# Patient Record
Sex: Male | Born: 1940
Health system: Southern US, Community
[De-identification: ages and names within clinical notes are randomized; demographics above are authoritative.]

## PROBLEM LIST (undated history)

## (undated) DIAGNOSIS — I10 Essential (primary) hypertension: Secondary | ICD-10-CM

## (undated) DIAGNOSIS — E785 Hyperlipidemia, unspecified: Secondary | ICD-10-CM

## (undated) DIAGNOSIS — I639 Cerebral infarction, unspecified: Secondary | ICD-10-CM

## (undated) HISTORY — PX: RUPTURED GLOBE EXPLORATION AND REPAIR: SHX2366

## (undated) HISTORY — PX: ENUCLEATION: SHX628

---

## 2004-04-03 ENCOUNTER — Other Ambulatory Visit: Payer: Self-pay

## 2004-10-14 ENCOUNTER — Inpatient Hospital Stay: Payer: Self-pay

## 2005-01-20 ENCOUNTER — Other Ambulatory Visit: Payer: Self-pay

## 2005-01-20 ENCOUNTER — Inpatient Hospital Stay: Payer: Self-pay | Admitting: Anesthesiology

## 2005-11-13 ENCOUNTER — Other Ambulatory Visit: Payer: Self-pay

## 2005-11-13 ENCOUNTER — Inpatient Hospital Stay: Payer: Self-pay | Admitting: Internal Medicine

## 2005-11-16 ENCOUNTER — Ambulatory Visit: Payer: Self-pay | Admitting: Psychiatry

## 2006-01-11 ENCOUNTER — Other Ambulatory Visit: Payer: Self-pay

## 2006-01-11 ENCOUNTER — Emergency Department: Payer: Self-pay | Admitting: Unknown Physician Specialty

## 2006-02-23 ENCOUNTER — Ambulatory Visit: Payer: Self-pay | Admitting: Pain Medicine

## 2006-03-03 ENCOUNTER — Ambulatory Visit: Payer: Self-pay | Admitting: Pain Medicine

## 2006-03-08 ENCOUNTER — Ambulatory Visit: Payer: Self-pay | Admitting: Pain Medicine

## 2006-04-10 ENCOUNTER — Inpatient Hospital Stay: Payer: Self-pay | Admitting: Internal Medicine

## 2006-04-10 ENCOUNTER — Other Ambulatory Visit: Payer: Self-pay

## 2006-04-13 ENCOUNTER — Ambulatory Visit: Payer: Self-pay | Admitting: Pain Medicine

## 2006-04-19 ENCOUNTER — Ambulatory Visit: Payer: Self-pay | Admitting: Pain Medicine

## 2006-06-03 ENCOUNTER — Ambulatory Visit: Payer: Self-pay | Admitting: Pain Medicine

## 2006-06-21 ENCOUNTER — Ambulatory Visit: Payer: Self-pay | Admitting: Pain Medicine

## 2006-07-20 ENCOUNTER — Ambulatory Visit: Payer: Self-pay | Admitting: Pain Medicine

## 2006-07-26 ENCOUNTER — Ambulatory Visit: Payer: Self-pay | Admitting: Pain Medicine

## 2006-10-12 ENCOUNTER — Ambulatory Visit: Payer: Self-pay | Admitting: Pain Medicine

## 2006-10-25 ENCOUNTER — Ambulatory Visit: Payer: Self-pay | Admitting: Pain Medicine

## 2006-12-02 ENCOUNTER — Ambulatory Visit: Payer: Self-pay | Admitting: Pain Medicine

## 2006-12-13 ENCOUNTER — Ambulatory Visit: Payer: Self-pay | Admitting: Pain Medicine

## 2006-12-27 ENCOUNTER — Other Ambulatory Visit: Payer: Self-pay

## 2006-12-27 ENCOUNTER — Inpatient Hospital Stay: Payer: Self-pay | Admitting: *Deleted

## 2007-01-13 ENCOUNTER — Ambulatory Visit: Payer: Self-pay | Admitting: Pain Medicine

## 2007-01-24 ENCOUNTER — Ambulatory Visit: Payer: Self-pay | Admitting: Pain Medicine

## 2007-03-03 ENCOUNTER — Ambulatory Visit: Payer: Self-pay | Admitting: Pain Medicine

## 2007-03-07 ENCOUNTER — Ambulatory Visit: Payer: Self-pay | Admitting: Pain Medicine

## 2007-04-21 ENCOUNTER — Ambulatory Visit: Payer: Self-pay | Admitting: Pain Medicine

## 2007-04-27 ENCOUNTER — Ambulatory Visit: Payer: Self-pay | Admitting: Pain Medicine

## 2007-06-06 ENCOUNTER — Emergency Department: Payer: Self-pay | Admitting: Emergency Medicine

## 2007-06-06 ENCOUNTER — Other Ambulatory Visit: Payer: Self-pay

## 2007-07-01 ENCOUNTER — Inpatient Hospital Stay: Payer: Self-pay | Admitting: Internal Medicine

## 2007-07-01 ENCOUNTER — Other Ambulatory Visit: Payer: Self-pay

## 2007-11-23 ENCOUNTER — Other Ambulatory Visit: Payer: Self-pay

## 2007-11-24 ENCOUNTER — Other Ambulatory Visit: Payer: Self-pay

## 2007-11-24 ENCOUNTER — Inpatient Hospital Stay: Payer: Self-pay | Admitting: Internal Medicine

## 2008-03-17 ENCOUNTER — Inpatient Hospital Stay: Payer: Self-pay | Admitting: Internal Medicine

## 2009-03-05 ENCOUNTER — Inpatient Hospital Stay: Payer: Self-pay | Admitting: Internal Medicine

## 2010-02-21 ENCOUNTER — Encounter: Admission: RE | Admit: 2010-02-21 | Discharge: 2010-02-21 | Payer: Self-pay | Admitting: Cardiovascular Disease

## 2010-04-07 ENCOUNTER — Encounter: Admission: RE | Admit: 2010-04-07 | Discharge: 2010-04-07 | Payer: Self-pay | Admitting: Orthopedic Surgery

## 2010-04-28 ENCOUNTER — Ambulatory Visit (HOSPITAL_COMMUNITY): Admission: RE | Admit: 2010-04-28 | Discharge: 2010-04-28 | Payer: Self-pay | Admitting: Orthopedic Surgery

## 2010-11-28 LAB — BASIC METABOLIC PANEL
BUN: 10 mg/dL (ref 6–23)
Calcium: 9.6 mg/dL (ref 8.4–10.5)
Potassium: 4.1 mEq/L (ref 3.5–5.1)
Sodium: 138 mEq/L (ref 135–145)

## 2010-11-28 LAB — CBC
Hemoglobin: 11.9 g/dL — ABNORMAL LOW (ref 13.0–17.0)
MCH: 31.3 pg (ref 26.0–34.0)
MCV: 93.2 fL (ref 78.0–100.0)
RBC: 3.8 MIL/uL — ABNORMAL LOW (ref 4.22–5.81)
WBC: 4.5 10*3/uL (ref 4.0–10.5)

## 2010-11-28 LAB — SURGICAL PCR SCREEN
MRSA, PCR: NEGATIVE
Staphylococcus aureus: NEGATIVE

## 2012-10-24 ENCOUNTER — Other Ambulatory Visit: Payer: Self-pay | Admitting: Cardiovascular Disease

## 2012-10-24 DIAGNOSIS — G459 Transient cerebral ischemic attack, unspecified: Secondary | ICD-10-CM

## 2012-10-28 ENCOUNTER — Other Ambulatory Visit: Payer: Self-pay

## 2012-11-07 ENCOUNTER — Ambulatory Visit
Admission: RE | Admit: 2012-11-07 | Discharge: 2012-11-07 | Disposition: A | Discharge status: Home / Self Care | Payer: Medicare Other | Source: Ambulatory Visit | Attending: Cardiovascular Disease | Admitting: Cardiovascular Disease

## 2012-11-07 DIAGNOSIS — G459 Transient cerebral ischemic attack, unspecified: Secondary | ICD-10-CM

## 2015-03-15 ENCOUNTER — Observation Stay
Admission: EM | Admit: 2015-03-15 | Discharge: 2015-03-16 | Disposition: A | Discharge status: Home / Self Care | Payer: Medicare Other | Attending: Internal Medicine | Admitting: Internal Medicine

## 2015-03-15 ENCOUNTER — Emergency Department: Payer: Medicare Other

## 2015-03-15 DIAGNOSIS — Z9001 Acquired absence of eye: Secondary | ICD-10-CM | POA: Insufficient documentation

## 2015-03-15 DIAGNOSIS — R42 Dizziness and giddiness: Secondary | ICD-10-CM | POA: Diagnosis not present

## 2015-03-15 DIAGNOSIS — Z7902 Long term (current) use of antithrombotics/antiplatelets: Secondary | ICD-10-CM | POA: Insufficient documentation

## 2015-03-15 DIAGNOSIS — Z8673 Personal history of transient ischemic attack (TIA), and cerebral infarction without residual deficits: Secondary | ICD-10-CM | POA: Insufficient documentation

## 2015-03-15 DIAGNOSIS — Z79899 Other long term (current) drug therapy: Secondary | ICD-10-CM | POA: Diagnosis not present

## 2015-03-15 DIAGNOSIS — R531 Weakness: Secondary | ICD-10-CM | POA: Insufficient documentation

## 2015-03-15 DIAGNOSIS — F17210 Nicotine dependence, cigarettes, uncomplicated: Secondary | ICD-10-CM | POA: Insufficient documentation

## 2015-03-15 DIAGNOSIS — R062 Wheezing: Secondary | ICD-10-CM

## 2015-03-15 DIAGNOSIS — G459 Transient cerebral ischemic attack, unspecified: Secondary | ICD-10-CM | POA: Diagnosis present

## 2015-03-15 DIAGNOSIS — E785 Hyperlipidemia, unspecified: Secondary | ICD-10-CM | POA: Insufficient documentation

## 2015-03-15 DIAGNOSIS — Z8249 Family history of ischemic heart disease and other diseases of the circulatory system: Secondary | ICD-10-CM | POA: Insufficient documentation

## 2015-03-15 DIAGNOSIS — I10 Essential (primary) hypertension: Secondary | ICD-10-CM | POA: Diagnosis not present

## 2015-03-15 HISTORY — DX: Essential (primary) hypertension: I10

## 2015-03-15 HISTORY — DX: Hyperlipidemia, unspecified: E78.5

## 2015-03-15 HISTORY — DX: Cerebral infarction, unspecified: I63.9

## 2015-03-15 NOTE — ED Provider Notes (Signed)
[facility] Emergency Department Provider Note  ____________________________________________  Time seen: Approximately 11:15 PM  I have reviewed the triage vital signs and the nursing notes.   HISTORY  Chief Complaint Numbness and Near Syncope    HPI Terry Knox is a 74 y.o. male who presents to the ED via EMS for lightheadedness, dizziness and left arm weakness. Symptoms started approximately 9 PM while patient was getting up to wash the dishes. He felt a combination of the room spinning as well as lightheaded and felt like he was going to pass out. He sat down quickly so that he would not fall. He reports numbness in the left upper extremity. He was nauseated and reports some sweating, but no vomiting. Patient denies headache, chest pain, shortness of breath, diarrhea, weakness. Patient has a history of TIAs and is on Plavix daily. Reports improving symptoms.States the paramedics told him his speech was slurred upon their arrival.   Past Medical History  Diagnosis Date  . Hypertension   . Stroke     There are no active problems to display for this patient.  Past surgical history Right eye prosthesis   No current outpatient prescriptions on file.  Allergies Review of patient's allergies indicates no known allergies.  No family history on file.  Social History History  Substance Use Topics  . Smoking status: Not on file  . Smokeless tobacco: Not on file  . Alcohol Use: Not on file  No recent alcohol use  Review of Systems Constitutional: No fever/chills Eyes: No visual changes. ENT: No sore throat. Cardiovascular: Denies chest pain. Respiratory: Denies shortness of breath. Gastrointestinal: No abdominal pain.  No nausea, no vomiting.  No diarrhea.  No constipation. Genitourinary: Negative for dysuria. Musculoskeletal: Negative for back pain. Skin: Negative for rash. Neurological: Negative for headaches, focal weakness. Positive for left  arm numbness.  10-point ROS otherwise negative.  ____________________________________________   PHYSICAL EXAM:  VITAL SIGNS: ED Triage Vitals  Enc Vitals Group     BP 03/15/15 2242 150/82 mmHg     Pulse Rate 03/15/15 2242 54     Resp 03/15/15 2242 16     Temp 03/15/15 2242 97.9 F (36.6 C)     Temp Source 03/15/15 2242 Oral     SpO2 03/15/15 2242 99 %     Weight 03/15/15 2242 140 lb (63.504 kg)     Height 03/15/15 2242 '5\' 9"'$  (1.753 m)     Head Cir --      Peak Flow --      Pain Score --      Pain Loc --      Pain Edu? --      Excl. in [place]? --     Constitutional: Alert and oriented. Cachectic and in no acute distress. Eyes: Conjunctivae are normal. PERRL. EOMI. Right prosthetic eye. Head: Atraumatic. Nose: No congestion/rhinnorhea. Mouth/Throat: Mucous membranes are moist.  Oropharynx non-erythematous. Neck: No stridor. No carotid bruits. Cardiovascular: Bradycardic, regular rhythm. Grossly normal heart sounds.  Good peripheral circulation. Respiratory: Normal respiratory effort.  No retractions. Lungs CTAB. Gastrointestinal: Soft and nontender. No distention. No abdominal bruits. No CVA tenderness. Musculoskeletal: No lower extremity tenderness nor edema.  No joint effusions. Neurologic:  Normal speech and language. CN II-XII grossly intact. Very mild left hand grip and 4/5 left leg weakness. Speech is normal.  Skin:  Skin is warm, dry and intact. No rash noted. Psychiatric: Mood and affect are normal. Speech and behavior are normal.  ____________________________________________  LABS (all labs ordered are listed, but only abnormal results are displayed)  Labs Reviewed  COMPREHENSIVE METABOLIC PANEL  TROPONIN I  LIPASE, BLOOD  MAGNESIUM  CBC WITH DIFFERENTIAL/PLATELET   ____________________________________________  EKG  ED ECG REPORT I, Mckynlie Vanderslice J, the attending physician, personally viewed and interpreted this ECG.   Date: 03/15/2015  EKG Time: 2311   Rate: 56  Rhythm: sinus bradycardia  Axis: LAD  Intervals:first-degree A-V block   ST&T Change: Nonspecific  ____________________________________________  RADIOLOGY  CT head without contrast interpreted per Dr. Alroy Dust:  Mild generalized atrophy and chronic small vessel changes. Chronic mild inferior right frontal encephalomalacia, likely due to remote trauma. No acute findings.  ____________________________________________   PROCEDURES  Procedure(s) performed:  NIH Stroke Scale  Time: 2330 Person Administering Scale: Me  Administer stroke scale items in the order listed. Record performance in each category after each subscale exam. Do not go back and change scores. Follow directions provided for each exam technique. Scores should reflect what the patient does, not what the clinician thinks the patient can do. The clinician should record answers while administering the exam and work quickly. Except where indicated, the patient should not be coached (i.e., repeated requests to patient to make a special effort).   1a  Level of consciousness: 0  1b. LOC questions:  0  1c. LOC commands: 0  2.  Best Gaze: 0  3.  Visual: 0  4. Facial Palsy: 0  5a.  Motor left arm: 0  5b.  Motor right arm: 0  6a. motor left leg: 1  6b  Motor right leg:  0  7. Limb Ataxia: 0  8.  Sensory: 1  9. Best Language:  0  10. Dysarthria: 0  11. Extinction and Inattention: 0  12. Distal motor function: 0   Total:   2     Critical Care performed: No  ____________________________________________   INITIAL IMPRESSION / ASSESSMENT AND PLAN / ED COURSE  Pertinent labs & imaging results that were available during my care of the patient were reviewed by me and considered in my medical decision making (see chart for details).  74 year old male who presents with dizziness and left extremity numbness. Symptoms consistent with a TIA. Patient's NIH stroke scale does not qualify him for tPA and I did  discuss this with the patient directly. Will obtain basic labs, CT head; anticipate hospital admission.  ----------------------------------------- 12:58 AM on 03/16/2015 -----------------------------------------  Discussed case with Dr. Marcille Blanco will evaluate patient in the emergency department for hospital admission.  ___________________________________________   FINAL CLINICAL IMPRESSION(S) / ED DIAGNOSES  Final diagnoses:  Transient cerebral ischemia, unspecified transient cerebral ischemia type      Paulette Blanch, MD 03/16/15 [phone]
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
## 2015-03-16 ENCOUNTER — Observation Stay: Payer: Medicare Other

## 2015-03-16 ENCOUNTER — Observation Stay
Admit: 2015-03-16 | Discharge: 2015-03-16 | Disposition: A | Discharge status: Home / Self Care | Payer: Medicare Other | Attending: Internal Medicine | Admitting: Internal Medicine

## 2015-03-16 ENCOUNTER — Encounter: Payer: Self-pay | Admitting: Internal Medicine

## 2015-03-16 DIAGNOSIS — G451 Carotid artery syndrome (hemispheric): Secondary | ICD-10-CM | POA: Diagnosis not present

## 2015-03-16 DIAGNOSIS — G459 Transient cerebral ischemic attack, unspecified: Secondary | ICD-10-CM | POA: Diagnosis present

## 2015-03-16 LAB — TROPONIN I: Troponin I: <0.03 ng/mL (ref <0.031)

## 2015-03-16 LAB — COMPREHENSIVE METABOLIC PANEL
ALK PHOS: 43 U/L (ref 38–126)
ALT: 19 U/L (ref 17–63)
AST: 19 U/L (ref 15–41)
Albumin: 3.9 g/dL (ref 3.5–5.0)
Anion gap: 6 (ref 5–15)
BILIRUBIN TOTAL: 0.4 mg/dL (ref 0.3–1.2)
BUN: 15 mg/dL (ref 6–20)
CALCIUM: 9.4 mg/dL (ref 8.9–10.3)
CO2: 32 mmol/L (ref 22–32)
CREATININE: 0.86 mg/dL (ref 0.61–1.24)
Chloride: 102 mmol/L (ref 101–111)
GFR calc Af Amer: >60 mL/min (ref >60)
Glucose, Bld: 94 mg/dL (ref 65–99)
POTASSIUM: 4 mmol/L (ref 3.5–5.1)
Sodium: 140 mmol/L (ref 135–145)
TOTAL PROTEIN: 7.3 g/dL (ref 6.5–8.1)

## 2015-03-16 LAB — LIPASE, BLOOD: LIPASE: 49 U/L (ref 22–51)

## 2015-03-16 LAB — MAGNESIUM: MAGNESIUM: 1.7 mg/dL (ref 1.7–2.4)

## 2015-03-16 LAB — HEMOGLOBIN A1C: Hgb A1c MFr Bld: 6.1 % — ABNORMAL HIGH (ref 4.0–6.0)

## 2015-03-16 LAB — TSH: TSH: 1.251 u[IU]/mL (ref 0.350–4.500)

## 2015-03-16 LAB — PLATELET COUNT: Platelets: 193 10*3/uL (ref 150–440)

## 2015-03-16 MED ORDER — NICOTINE 21 MG/24HR TD PT24
21.0000 mg | MEDICATED_PATCH | Freq: Every day | TRANSDERMAL | Status: DC
  Administered 2015-03-16: 21 mg via TRANSDERMAL
  Filled 2015-03-16: qty 1

## 2015-03-16 MED ORDER — IPRATROPIUM-ALBUTEROL 0.5-2.5 (3) MG/3ML IN SOLN: 3.0000 mL | Freq: Four times a day (QID) | RESPIRATORY_TRACT | Status: DC

## 2015-03-16 MED ORDER — ACETAMINOPHEN 325 MG PO TABS: 650.0000 mg | ORAL_TABLET | Freq: Four times a day (QID) | ORAL | Status: DC | PRN

## 2015-03-16 MED ORDER — HEPARIN SODIUM (PORCINE) 5000 UNIT/ML IJ SOLN
5000.0000 [IU] | Freq: Three times a day (TID) | INTRAMUSCULAR | Status: DC
  Administered 2015-03-16 (×2): 5000 [IU] via SUBCUTANEOUS
  Filled 2015-03-16 (×2): qty 1

## 2015-03-16 MED ORDER — ACETAMINOPHEN 650 MG RE SUPP: 650.0000 mg | Freq: Four times a day (QID) | RECTAL | Status: DC | PRN

## 2015-03-16 MED ORDER — ASPIRIN 81 MG PO CHEW
CHEWABLE_TABLET | ORAL | Status: AC
  Filled 2015-03-16: qty 4

## 2015-03-16 MED ORDER — SIMVASTATIN 20 MG PO TABS
20.0000 mg | ORAL_TABLET | Freq: Every day | ORAL | Status: DC
  Administered 2015-03-16: 20 mg via ORAL
  Filled 2015-03-16: qty 1

## 2015-03-16 MED ORDER — HYDROCHLOROTHIAZIDE 12.5 MG PO CAPS
12.5000 mg | ORAL_CAPSULE | Freq: Every day | ORAL | Status: DC
  Filled 2015-03-16: qty 1

## 2015-03-16 MED ORDER — IPRATROPIUM-ALBUTEROL 0.5-2.5 (3) MG/3ML IN SOLN
3.0000 mL | Freq: Four times a day (QID) | RESPIRATORY_TRACT | Status: DC
  Administered 2015-03-16 (×2): 3 mL via RESPIRATORY_TRACT
  Filled 2015-03-16 (×3): qty 3

## 2015-03-16 MED ORDER — ASPIRIN 81 MG PO TBEC: 81.0000 mg | DELAYED_RELEASE_TABLET | Freq: Every day | ORAL | Status: DC

## 2015-03-16 MED ORDER — CLOPIDOGREL BISULFATE 75 MG PO TABS
75.0000 mg | ORAL_TABLET | Freq: Every day | ORAL | Status: DC
  Administered 2015-03-16: 75 mg via ORAL
  Filled 2015-03-16: qty 1

## 2015-03-16 MED ORDER — ONDANSETRON HCL 4 MG PO TABS: 4.0000 mg | ORAL_TABLET | Freq: Four times a day (QID) | ORAL | Status: DC | PRN

## 2015-03-16 MED ORDER — LISINOPRIL-HYDROCHLOROTHIAZIDE 10-12.5 MG PO TABS: 1.0000 | ORAL_TABLET | Freq: Every day | ORAL | Status: DC

## 2015-03-16 MED ORDER — MORPHINE SULFATE 2 MG/ML IJ SOLN: 1.0000 mg | INTRAMUSCULAR | Status: DC | PRN

## 2015-03-16 MED ORDER — ASPIRIN 81 MG PO CHEW
324.0000 mg | CHEWABLE_TABLET | Freq: Once | ORAL | Status: AC
  Administered 2015-03-16: 324 mg via ORAL

## 2015-03-16 MED ORDER — ASPIRIN EC 81 MG PO TBEC
81.0000 mg | DELAYED_RELEASE_TABLET | Freq: Every day | ORAL | Status: DC
  Administered 2015-03-16: 81 mg via ORAL
  Filled 2015-03-16: qty 1

## 2015-03-16 MED ORDER — LISINOPRIL 10 MG PO TABS
10.0000 mg | ORAL_TABLET | Freq: Every day | ORAL | Status: DC
  Filled 2015-03-16: qty 1

## 2015-03-16 MED ORDER — SODIUM CHLORIDE 0.9 % IJ SOLN
3.0000 mL | Freq: Two times a day (BID) | INTRAMUSCULAR | Status: DC
  Administered 2015-03-16 (×2): 3 mL via INTRAVENOUS

## 2015-03-16 MED ORDER — DOCUSATE SODIUM 100 MG PO CAPS
100.0000 mg | ORAL_CAPSULE | Freq: Two times a day (BID) | ORAL | Status: DC
  Administered 2015-03-16 (×2): 100 mg via ORAL
  Filled 2015-03-16 (×2): qty 1

## 2015-03-16 MED ORDER — ONDANSETRON HCL 4 MG/2ML IJ SOLN: 4.0000 mg | Freq: Four times a day (QID) | INTRAMUSCULAR | Status: DC | PRN

## 2015-03-16 NOTE — Care Management (Signed)
Onsite case manager notified of need for Medicare obs letter

## 2015-03-16 NOTE — Progress Notes (Signed)
*  PRELIMINARY RESULTS* Echocardiogram 2D Echocardiogram has been performed.  Terry Knox 03/16/2015, 11:35 AM

## 2015-03-16 NOTE — H&P (Addendum)
Terry Knox is an 74 y.o. male.   Chief Complaint: Left arm weakness HPI: The patient presents to the hospital complaining of left arm weakness. He states the arm had been weak for approximately 4 hours by the time of my exam. He denies any associated chest pain or shortness of breath. He admits to dizziness as well as nausea but did not vomit. He states that he is recently felt sick with "flu". By the time of our exam the patient states that his arm was nearly back to normal but due to his history of TIA the emergency department staff called for admission.  Past Medical History  Diagnosis Date  . Hypertension   . Stroke   . Hyperlipidemia     Past Surgical History  Procedure Laterality Date  . Enucleation Right   . Ruptured globe exploration and repair Left     Family History  Problem Relation Age of Onset  . Hypertension Mother    Social History:  reports that he has been smoking Cigarettes.  He has a 60 pack-year smoking history. He has never used smokeless tobacco. He reports that he does not drink alcohol or use illicit drugs.  Allergies: No Known Allergies  Prior to Admission medications   Medication Sig Start Date End Date Taking? Authorizing Provider  clopidogrel (PLAVIX) 75 MG tablet Take 75 mg by mouth daily.   Yes Historical Provider, MD  lisinopril-hydrochlorothiazide (PRINZIDE,ZESTORETIC) 10-12.5 MG per tablet Take 1 tablet by mouth daily.   Yes Historical Provider, MD  simvastatin (ZOCOR) 20 MG tablet Take 20 mg by mouth daily.   Yes Historical Provider, MD     Results for orders placed or performed during the hospital encounter of 03/15/15 (from the past 48 hour(s))  Comprehensive metabolic panel     Status: None   Collection Time: 03/16/15 12:01 AM  Result Value Ref Range   Sodium 140 135 - 145 mmol/L   Potassium 4.0 3.5 - 5.1 mmol/L   Chloride 102 101 - 111 mmol/L   CO2 32 22 - 32 mmol/L   Glucose, Bld 94 65 - 99 mg/dL   BUN 15 6 - 20 mg/dL   Creatinine,  Ser 0.86 0.61 - 1.24 mg/dL   Calcium 9.4 8.9 - 10.3 mg/dL   Total Protein 7.3 6.5 - 8.1 g/dL   Albumin 3.9 3.5 - 5.0 g/dL   AST 19 15 - 41 U/L   ALT 19 17 - 63 U/L   Alkaline Phosphatase 43 38 - 126 U/L   Total Bilirubin 0.4 0.3 - 1.2 mg/dL   GFR calc non Af Amer >60 >60 mL/min   GFR calc Af Amer >60 >60 mL/min    Comment: (NOTE) The eGFR has been calculated using the CKD EPI equation. This calculation has not been validated in all clinical situations. eGFR's persistently <60 mL/min signify possible Chronic Kidney Disease.    Anion gap 6 5 - 15  Troponin I     Status: None   Collection Time: 03/16/15 12:01 AM  Result Value Ref Range   Troponin I <0.03 <0.031 ng/mL    Comment:        NO INDICATION OF MYOCARDIAL INJURY.   Lipase, blood     Status: None   Collection Time: 03/16/15 12:01 AM  Result Value Ref Range   Lipase 49 22 - 51 U/L  Magnesium     Status: None   Collection Time: 03/16/15 12:01 AM  Result Value Ref Range   Magnesium  1.7 1.7 - 2.4 mg/dL   Ct Head Wo Contrast  03/15/2015   CLINICAL DATA:  Lightheadedness and left arm weakness, onset around 21:00.  EXAM: CT HEAD WITHOUT CONTRAST  TECHNIQUE: Contiguous axial images were obtained from the base of the skull through the vertex without intravenous contrast.  COMPARISON:  11/07/2012  FINDINGS: There is no intracranial hemorrhage, mass or evidence of acute infarction. There is no extra-axial fluid collection. There is mild generalized atrophy. There is white matter hypodensity suggesting chronic small vessel ischemic disease. There is more focal hypodensity in the inferior right frontal lobe which corresponds to encephalomalacia observed on the prior MRI. This most likely is post traumatic.  There is a right optic globe prosthesis and there is prior left optic globe surgery. There is no bony abnormality. The visible paranasal sinuses are clear.  IMPRESSION: Mild generalized atrophy and chronic small vessel changes. Chronic  mild inferior right frontal encephalomalacia, likely due to remote trauma. No acute findings.   Electronically Signed   By: Andreas Newport M.D.   On: 03/15/2015 23:40    Review of Systems  Constitutional: Negative for fever and chills.  HENT: Negative for sore throat and tinnitus.   Eyes: Negative for blurred vision and redness.  Respiratory: Negative for cough and shortness of breath.   Cardiovascular: Negative for chest pain, palpitations, orthopnea and PND.  Gastrointestinal: Negative for nausea, vomiting, abdominal pain and diarrhea.  Genitourinary: Negative for dysuria, urgency and frequency.  Musculoskeletal: Negative for myalgias and joint pain.  Skin: Negative for rash.       No lesions  Neurological: Positive for dizziness, sensory change and focal weakness. Negative for speech change and weakness.  Endo/Heme/Allergies: Does not bruise/bleed easily.       No temperature intolerance  Psychiatric/Behavioral: Negative for depression and suicidal ideas.    Blood pressure 114/68, pulse 65, temperature 98.2 F (36.8 C), temperature source Oral, resp. rate 16, height $RemoveBe'5\' 9"'LmlecSxdP$  (1.753 m), weight 63.504 kg (140 lb), SpO2 95 %. Physical Exam  Constitutional: He appears well-developed and well-nourished. No distress.  HENT:  Head: Normocephalic and atraumatic.  Mouth/Throat: Oropharynx is clear and moist.  Eyes: No scleral icterus. Right eye exhibits abnormal extraocular motion. Left pupil is round and reactive.  Right eye prosthetic  Neck: Normal range of motion. Neck supple. No JVD present. No tracheal deviation present. No thyromegaly present.  Cardiovascular: Normal rate, regular rhythm, normal heart sounds and intact distal pulses.  Exam reveals no gallop and no friction rub.   No murmur heard. Respiratory: Effort normal and breath sounds normal.  GI: Soft. Bowel sounds are normal. He exhibits no distension. There is no tenderness.  Genitourinary:  Deferred  Musculoskeletal:  Normal range of motion. He exhibits no edema.  Lymphadenopathy:    He has no cervical adenopathy.  Neurological: He is alert. No cranial nerve deficit.  Skin: Skin is warm and dry. No rash noted. No erythema.  Psychiatric: He has a normal mood and affect. His behavior is normal. Judgment and thought content normal.     Assessment/Plan This is a 74 year old [place] American male admitted for CVA. 1. CVA: The patient's symptoms have nearly resolved but have lasted for more than 3 hours. CT scan shows no acute stroke. I will place the patient on telemetry and evaluate his carotid arteries with Doppler ultrasound. I've also ordered an echocardiogram. Neurology consultation in the morning. 2. Hypertension: Continue lisinopril with hydrochlorothiazide 3. Hyperlipidemia: Continue statin therapy 4. Tobacco abuse: NicoDerm patch 5.  DVT prophylaxis: Heparin 6. GI prophylaxis: None The patient is a full code. Time spent on admission orders and patient care approximately 35 minutes  Harrie Foreman 03/16/2015, 2:16 AM

## 2015-03-16 NOTE — Consult Note (Signed)
CC: L sided weakness  HPI: Terry Knox is an 74 y.o. male presents to the hospital complaining of left arm weakness that lasted for 3-4 hours after which pt was back to [place]. Mri brain no acute abnormality.     Past Medical History  Diagnosis Date  . Hypertension   . Stroke   . Hyperlipidemia     Past Surgical History  Procedure Laterality Date  . Enucleation Right   . Ruptured globe exploration and repair Left     Family History  Problem Relation Age of Onset  . Hypertension Mother     Social History:  reports that he has been smoking Cigarettes.  He has a 60 pack-year smoking history. He has never used smokeless tobacco. He reports that he does not drink alcohol or use illicit drugs.  No Known Allergies  Medications: I have reviewed the patient's current medications.  ROS: History obtained from chart review  General ROS: negative for - chills, fatigue, fever, night sweats, weight gain or weight loss Psychological ROS: negative for - behavioral disorder, hallucinations, memory difficulties, mood swings or suicidal ideation Ophthalmic ROS: negative for - blurry vision, double vision, eye pain or loss of vision ENT ROS: negative for - epistaxis, nasal discharge, oral lesions, sore throat, tinnitus or vertigo Allergy and Immunology ROS: negative for - hives or itchy/watery eyes Hematological and Lymphatic ROS: negative for - bleeding problems, bruising or swollen lymph nodes Endocrine ROS: negative for - galactorrhea, hair pattern changes, polydipsia/polyuria or temperature intolerance Respiratory ROS: negative for - cough, hemoptysis, shortness of breath or wheezing Cardiovascular ROS: negative for - chest pain, dyspnea on exertion, edema or irregular heartbeat Gastrointestinal ROS: negative for - abdominal pain, diarrhea, hematemesis, nausea/vomiting or stool incontinence Genito-Urinary ROS: negative for - dysuria, hematuria, incontinence or urinary  frequency/urgency Musculoskeletal ROS: negative for - joint swelling or muscular weakness Neurological ROS: as noted in HPI Dermatological ROS: negative for rash and skin lesion changes  Physical Examination: Blood pressure 139/64, pulse 69, temperature 97.5 F (36.4 C), temperature source Oral, resp. rate 20, height '5\' 9"'$  (1.753 m), weight 57.017 kg (125 lb 11.2 oz), SpO2 96 %.  Neurological Examination Mental Status: Alert, oriented, thought content appropriate.  Speech fluent without evidence of aphasia.  Able to follow 3 step commands without difficulty. Cranial Nerves: II: Discs flat bilaterally; Visual fields grossly normal, pupils equal, round, reactive to light and accommodation III,IV, VI: ptosis not present, extra-ocular motions intact bilaterally V,VII: smile symmetric, facial light touch sensation normal bilaterally VIII: hearing normal bilaterally IX,X: gag reflex present XI: bilateral shoulder shrug XII: midline tongue extension Motor: Right : Upper extremity   5/5    Left:     Upper extremity   5/5  Lower extremity   5/5     Lower extremity   5/5 Tone and bulk:normal tone throughout; no atrophy noted Sensory: Pinprick and light touch intact throughout, bilaterally Deep Tendon Reflexes: 1+ and symmetric throughout Plantars: Right: downgoing   Left: downgoing Cerebellar: normal finger-to-nose, normal rapid alternating movements and normal heel-to-shin test Gait: not tested.       Laboratory Studies:   Basic Metabolic Panel:  Recent Labs Lab 03/16/15 0001  NA 140  K 4.0  CL 102  CO2 32  GLUCOSE 94  BUN 15  CREATININE 0.86  CALCIUM 9.4  MG 1.7    Liver Function Tests:  Recent Labs Lab 03/16/15 0001  AST 19  ALT 19  ALKPHOS 43  BILITOT 0.4  PROT  7.3  ALBUMIN 3.9    Recent Labs Lab 03/16/15 0001  LIPASE 49   No results for input(s): AMMONIA in the last 168 hours.  CBC: No results for input(s): WBC, NEUTROABS, HGB, HCT, MCV, PLT in the  last 168 hours.  Cardiac Enzymes:  Recent Labs Lab 03/16/15 0001  TROPONINI <0.03    BNP: Invalid input(s): POCBNP  CBG: No results for input(s): GLUCAP in the last 168 hours.  Microbiology: Results for orders placed or performed during the hospital encounter of 04/28/10  Surgical pcr screen     Status: None   Collection Time: 04/25/10  3:17 PM  Result Value Ref Range Status   MRSA, PCR NEGATIVE NEGATIVE Final   Staphylococcus aureus  NEGATIVE Final    NEGATIVE        The Xpert SA Assay (FDA approved for NASAL specimens only), is one component of a comprehensive surveillance program.  It is not intended to diagnose infection nor to guide or monitor treatment.    Coagulation Studies: No results for input(s): LABPROT, INR in the last 72 hours.  Urinalysis: No results for input(s): COLORURINE, LABSPEC, PHURINE, GLUCOSEU, HGBUR, BILIRUBINUR, KETONESUR, PROTEINUR, UROBILINOGEN, NITRITE, LEUKOCYTESUR in the last 168 hours.  Invalid input(s): APPERANCEUR  Lipid Panel:  No results found for: CHOL, TRIG, HDL, CHOLHDL, VLDL, LDLCALC  HgbA1C:  Lab Results  Component Value Date   HGBA1C 6.1* 03/16/2015    Urine Drug Screen:  No results found for: LABOPIA, COCAINSCRNUR, LABBENZ, AMPHETMU, THCU, LABBARB  Alcohol Level: No results for input(s): ETH in the last 168 hours.  Other results: EKG: normal EKG, normal sinus rhythm, unchanged from previous tracings.  Imaging:    Assessment/Plan:  74 y.o. male presents to the hospital complaining of left arm weakness that lasted for 3-4 hours after which pt was back to [place]. Mri brain no acute abnormality.    - Pt currently back to baseline.  - Finish stroke work up (2d echo/carotid/HbA1c, lipid panel) - ASA and statin after which d/c planning Terry Knox   03/16/2015, 3:06 PM

## 2015-03-16 NOTE — Progress Notes (Signed)
Pt has been A&O, VSS, with no complaints of pain or discomfort. Went down for US carotids and MRI which were negative. Orders to D/C pt to home. IV and tele removed, discharge instructions and prescription given to pt. Pt currently awaiting ride home.

## 2015-03-16 NOTE — Care Management Note (Signed)
Case Management Note  Patient Details  Name: KENAZ OLAFSON MRN: [id number] Date of Birth: 1941-02-01  Subjective/Objective:       Reviewed Medicare Observation Status Notification form with Mr Chesney Klimaszewski, and provided him with a copy for reference. Original in pt's "shadow chart."             Action/Plan:   Expected Discharge Date:                  Expected Discharge Plan:     In-House Referral:     Discharge planning Services     Post Acute Care Choice:    Choice offered to:     DME Arranged:    DME Agency:     HH Arranged:    [place] Agency:     Status of Service:     Medicare Important Message Given:    Date Medicare IM Given:    Medicare IM give by:    Date Additional Medicare IM Given:    Additional Medicare Important Message give by:     If discussed at [place] of Stay Meetings, dates discussed:    Additional Comments:  Yides Saidi A, RN 03/16/2015, 5:52 PM

## 2015-03-16 NOTE — Discharge Instructions (Signed)
°  DIET:  Cardiac diet  DISCHARGE CONDITION:  Stable  ACTIVITY:  Activity as tolerated  OXYGEN:  Home Oxygen: No.   Oxygen Delivery: room air  DISCHARGE LOCATION:  home   If you experience worsening of your admission symptoms, develop shortness of breath, life threatening emergency, suicidal or homicidal thoughts you must seek medical attention immediately by calling 911 or calling your MD immediately  if symptoms less severe.  You Must read complete instructions/literature along with all the possible adverse reactions/side effects for all the Medicines you take and that have been prescribed to you. Take any new Medicines after you have completely understood and accpet all the possible adverse reactions/side effects.   Please note  You were cared for by a hospitalist during your hospital stay. If you have any questions about your discharge medications or the care you received while you were in the hospital after you are discharged, you can call the unit and asked to speak with the hospitalist on call if the hospitalist that took care of you is not available. Once you are discharged, your primary care physician will handle any further medical issues. Please note that NO REFILLS for any discharge medications will be authorized once you are discharged, as it is imperative that you return to your primary care physician (or establish a relationship with a primary care physician if you do not have one) for your aftercare needs so that they can reassess your need for medications and monitor your lab values.  QUIT SMOKING

## 2015-03-19 NOTE — Discharge Summary (Signed)
[facility] NAME: Terry Knox    MR#:  [id number]  DATE OF BIRTH:  07/18/1941  DATE OF ADMISSION:  03/15/2015 ADMITTING PHYSICIAN: Harrie Foreman, MD  DATE OF DISCHARGE: 03/16/2015  6:07 PM  PRIMARY CARE PHYSICIAN: Birdie Riddle, MD    ADMISSION DIAGNOSIS:  TIA (transient ischemic attack) [G45.9] Transient cerebral ischemia, unspecified transient cerebral ischemia type [G45.9]  DISCHARGE DIAGNOSIS:  Active Problems:   TIA (transient ischemic attack)   SECONDARY DIAGNOSIS:   Past Medical History  Diagnosis Date  . Hypertension   . Stroke   . Hyperlipidemia      ADMITTING HISTORY  HPI: The patient presents to the hospital complaining of left arm weakness. He states the arm had been weak for approximately 4 hours by the time of my exam. He denies any associated chest pain or shortness of breath. He admits to dizziness as well as nausea but did not vomit. He states that he is recently felt sick with "flu". By the time of our exam the patient states that his arm was nearly back to normal but due to his history of TIA the emergency department staff called for admission.   HOSPITAL COURSE:   TIA HTN  Admitted to Tele floor and had MRI, Echo and carotid dopplers that showed nothing acute. He is started on ASA< Plavix, Statin, BP meds and discharged home in stable condition. No Afib/flutter on tele. Follow up with PCP in 1-2 weeks.  Stable at time of discharge with no neurological deficits.   CONSULTS OBTAINED:  Treatment Team:  Leotis Pain, MD  DRUG ALLERGIES:  No Known Allergies  DISCHARGE MEDICATIONS:   Discharge Medication List as of 03/16/2015  4:28 PM    START taking these medications   Details  aspirin EC 81 MG EC tablet Take 1 tablet (81 mg total) by mouth daily., Starting 03/16/2015, Until Discontinued, OTC      CONTINUE these medications which have NOT CHANGED   Details  clopidogrel (PLAVIX)  75 MG tablet Take 75 mg by mouth daily., Until Discontinued, Historical Med    lisinopril-hydrochlorothiazide (PRINZIDE,ZESTORETIC) 10-12.5 MG per tablet Take 1 tablet by mouth daily., Until Discontinued, Historical Med    simvastatin (ZOCOR) 20 MG tablet Take 20 mg by mouth daily., Until Discontinued, Historical Med         Today    VITAL SIGNS:  Blood pressure 139/64, pulse 69, temperature 97.5 F (36.4 C), temperature source Oral, resp. rate 20, height '5\' 9"'$  (1.753 m), weight 57.017 kg (125 lb 11.2 oz), SpO2 96 %.  I/O:  No intake or output data in the 24 hours ending 03/19/15 1620  PHYSICAL EXAMINATION:  Physical Exam  GENERAL:  74 y.o.-year-old patient lying in the bed with no acute distress.  LUNGS: Normal breath sounds bilaterally, no wheezing, rales,rhonchi or crepitation. No use of accessory muscles of respiration.  CARDIOVASCULAR: S1, S2 normal. No murmurs, rubs, or gallops.  ABDOMEN: Soft, non-tender, non-distended. Bowel sounds present. No organomegaly or mass.  NEUROLOGIC: Moves all 4 extremities. PSYCHIATRIC: The patient is alert and oriented x 3.  SKIN: No obvious rash, lesion, or ulcer.   DATA REVIEW:   CBC  Recent Labs Lab 03/16/15 1517  PLT 193    Chemistries   Recent Labs Lab 03/16/15 0001  NA 140  K 4.0  CL 102  CO2 32  GLUCOSE 94  BUN 15  CREATININE 0.86  CALCIUM 9.4  MG 1.7  AST 19  ALT 19  ALKPHOS 43  BILITOT 0.4    Cardiac Enzymes  Recent Labs Lab 03/16/15 0001  TROPONINI <0.03    Microbiology Results  Results for orders placed or performed during the hospital encounter of 04/28/10  Surgical pcr screen     Status: None   Collection Time: 04/25/10  3:17 PM  Result Value Ref Range Status   MRSA, PCR NEGATIVE NEGATIVE Final   Staphylococcus aureus  NEGATIVE Final    NEGATIVE        The Xpert SA Assay (FDA approved for NASAL specimens only), is one component of a comprehensive surveillance program.  It is not  intended to diagnose infection nor to guide or monitor treatment.    RADIOLOGY:  No results found.    Follow up with PCP in 1 week.  Management plans discussed with the patient, family and they are in agreement.  CODE STATUS:  Advance Directive Documentation        Most Recent Value   Type of Advance Directive  Healthcare Power of Attorney   Pre-existing out of facility DNR order (yellow form or pink MOST form)     "MOST" Form in Place?        TOTAL TIME TAKING CARE OF THIS PATIENT ON DAY OF DISCHARGE: more than 30 minutes.    Hillary Bow R M.D on 03/19/2015 at 4:20 PM  Between 7am to 6pm - Pager - [phone]  After 6pm go to [email or web address] - password [email or web address] [facility]  Office  [phone]  CC: Primary care physician; Birdie Riddle, MD

## 2015-06-05 ENCOUNTER — Encounter (HOSPITAL_COMMUNITY): Payer: Self-pay | Admitting: *Deleted

## 2015-06-05 ENCOUNTER — Inpatient Hospital Stay (HOSPITAL_COMMUNITY)
Admission: AD | Admit: 2015-06-05 | Discharge: 2015-06-08 | DRG: 069 | Disposition: A | Discharge status: Home / Self Care | Payer: Medicare Other | Source: Ambulatory Visit | Attending: Cardiovascular Disease | Admitting: Cardiovascular Disease

## 2015-06-05 DIAGNOSIS — I10 Essential (primary) hypertension: Secondary | ICD-10-CM | POA: Diagnosis present

## 2015-06-05 DIAGNOSIS — R079 Chest pain, unspecified: Secondary | ICD-10-CM

## 2015-06-05 DIAGNOSIS — E785 Hyperlipidemia, unspecified: Secondary | ICD-10-CM | POA: Diagnosis present

## 2015-06-05 DIAGNOSIS — Z7902 Long term (current) use of antithrombotics/antiplatelets: Secondary | ICD-10-CM

## 2015-06-05 DIAGNOSIS — Z8249 Family history of ischemic heart disease and other diseases of the circulatory system: Secondary | ICD-10-CM | POA: Diagnosis not present

## 2015-06-05 DIAGNOSIS — M25511 Pain in right shoulder: Secondary | ICD-10-CM | POA: Diagnosis present

## 2015-06-05 DIAGNOSIS — Z7982 Long term (current) use of aspirin: Secondary | ICD-10-CM

## 2015-06-05 DIAGNOSIS — Z8673 Personal history of transient ischemic attack (TIA), and cerebral infarction without residual deficits: Secondary | ICD-10-CM | POA: Diagnosis not present

## 2015-06-05 DIAGNOSIS — H409 Unspecified glaucoma: Secondary | ICD-10-CM | POA: Diagnosis present

## 2015-06-05 DIAGNOSIS — F17210 Nicotine dependence, cigarettes, uncomplicated: Secondary | ICD-10-CM | POA: Diagnosis present

## 2015-06-05 DIAGNOSIS — R29898 Other symptoms and signs involving the musculoskeletal system: Secondary | ICD-10-CM

## 2015-06-05 DIAGNOSIS — G459 Transient cerebral ischemic attack, unspecified: Principal | ICD-10-CM | POA: Diagnosis present

## 2015-06-05 LAB — CBC WITH DIFFERENTIAL/PLATELET
BASOS ABS: 0 10*3/uL (ref 0.0–0.1)
BASOS PCT: 1 %
EOS ABS: 0.3 10*3/uL (ref 0.0–0.7)
EOS PCT: 9 %
HCT: 37.8 % — ABNORMAL LOW (ref 39.0–52.0)
Hemoglobin: 13.2 g/dL (ref 13.0–17.0)
LYMPHS PCT: 48 %
Lymphs Abs: 1.9 10*3/uL (ref 0.7–4.0)
MCH: 32.8 pg (ref 26.0–34.0)
MCHC: 34.9 g/dL (ref 30.0–36.0)
MCV: 94 fL (ref 78.0–100.0)
MONO ABS: 0.3 10*3/uL (ref 0.1–1.0)
Monocytes Relative: 7 %
Neutro Abs: 1.4 10*3/uL — ABNORMAL LOW (ref 1.7–7.7)
Neutrophils Relative %: 35 %
PLATELETS: 229 10*3/uL (ref 150–400)
RBC: 4.02 MIL/uL — ABNORMAL LOW (ref 4.22–5.81)
RDW: 12.9 % (ref 11.5–15.5)
WBC: 3.9 10*3/uL — ABNORMAL LOW (ref 4.0–10.5)

## 2015-06-05 LAB — COMPREHENSIVE METABOLIC PANEL
ALK PHOS: 41 U/L (ref 38–126)
ALT: 20 U/L (ref 17–63)
AST: 24 U/L (ref 15–41)
Albumin: 3.8 g/dL (ref 3.5–5.0)
Anion gap: 7 (ref 5–15)
BILIRUBIN TOTAL: 0.4 mg/dL (ref 0.3–1.2)
BUN: 10 mg/dL (ref 6–20)
CALCIUM: 9.8 mg/dL (ref 8.9–10.3)
CO2: 33 mmol/L — ABNORMAL HIGH (ref 22–32)
Chloride: 101 mmol/L (ref 101–111)
Creatinine, Ser: 0.81 mg/dL (ref 0.61–1.24)
GFR calc Af Amer: >60 mL/min (ref >60)
Glucose, Bld: 144 mg/dL — ABNORMAL HIGH (ref 65–99)
Potassium: 3.6 mmol/L (ref 3.5–5.1)
Sodium: 141 mmol/L (ref 135–145)
TOTAL PROTEIN: 7 g/dL (ref 6.5–8.1)

## 2015-06-05 LAB — TSH: TSH: 1.354 u[IU]/mL (ref 0.350–4.500)

## 2015-06-05 LAB — TROPONIN I

## 2015-06-05 MED ORDER — SIMVASTATIN 20 MG PO TABS
20.0000 mg | ORAL_TABLET | Freq: Every day | ORAL | Status: DC
  Administered 2015-06-05 – 2015-06-07 (×3): 20 mg via ORAL
  Filled 2015-06-05 (×3): qty 1

## 2015-06-05 MED ORDER — ONDANSETRON HCL 4 MG/2ML IJ SOLN: 4.0000 mg | Freq: Four times a day (QID) | INTRAMUSCULAR | Status: DC | PRN

## 2015-06-05 MED ORDER — ASPIRIN EC 81 MG PO TBEC
81.0000 mg | DELAYED_RELEASE_TABLET | Freq: Every day | ORAL | Status: DC
  Administered 2015-06-06 – 2015-06-08 (×3): 81 mg via ORAL
  Filled 2015-06-05 (×3): qty 1

## 2015-06-05 MED ORDER — ASPIRIN 81 MG PO CHEW
324.0000 mg | CHEWABLE_TABLET | ORAL | Status: AC
  Administered 2015-06-05: 324 mg via ORAL
  Filled 2015-06-05: qty 4

## 2015-06-05 MED ORDER — NITROGLYCERIN 0.4 MG SL SUBL: 0.4000 mg | SUBLINGUAL_TABLET | SUBLINGUAL | Status: DC | PRN

## 2015-06-05 MED ORDER — ACETAMINOPHEN 325 MG PO TABS: 650.0000 mg | ORAL_TABLET | ORAL | Status: DC | PRN

## 2015-06-05 MED ORDER — INFLUENZA VAC SPLIT QUAD 0.5 ML IM SUSY
0.5000 mL | PREFILLED_SYRINGE | INTRAMUSCULAR | Status: AC
  Administered 2015-06-06: 0.5 mL via INTRAMUSCULAR
  Filled 2015-06-05: qty 0.5

## 2015-06-05 MED ORDER — SODIUM CHLORIDE 0.9 % IJ SOLN
3.0000 mL | Freq: Two times a day (BID) | INTRAMUSCULAR | Status: DC
  Administered 2015-06-05 – 2015-06-08 (×4): 3 mL via INTRAVENOUS

## 2015-06-05 MED ORDER — SODIUM CHLORIDE 0.9 % IV SOLN: 250.0000 mL | INTRAVENOUS | Status: DC | PRN

## 2015-06-05 MED ORDER — SODIUM CHLORIDE 0.9 % IJ SOLN: 3.0000 mL | INTRAMUSCULAR | Status: DC | PRN

## 2015-06-05 MED ORDER — ASPIRIN 300 MG RE SUPP
300.0000 mg | RECTAL | Status: AC
  Filled 2015-06-05: qty 1

## 2015-06-05 MED ORDER — HEPARIN (PORCINE) IN NACL 100-0.45 UNIT/ML-% IJ SOLN
950.0000 [IU]/h | INTRAMUSCULAR | Status: DC
  Administered 2015-06-05: 700 [IU]/h via INTRAVENOUS
  Administered 2015-06-06: 950 [IU]/h via INTRAVENOUS
  Filled 2015-06-05 (×2): qty 250

## 2015-06-05 MED ORDER — HEPARIN BOLUS VIA INFUSION
3000.0000 [IU] | Freq: Once | INTRAVENOUS | Status: AC
  Administered 2015-06-05: 3000 [IU] via INTRAVENOUS
  Filled 2015-06-05: qty 3000

## 2015-06-05 MED ORDER — ALPRAZOLAM 0.25 MG PO TABS: 0.2500 mg | ORAL_TABLET | Freq: Two times a day (BID) | ORAL | Status: DC | PRN

## 2015-06-05 MED ORDER — CLOPIDOGREL BISULFATE 75 MG PO TABS
75.0000 mg | ORAL_TABLET | Freq: Every day | ORAL | Status: DC
  Administered 2015-06-06 – 2015-06-08 (×3): 75 mg via ORAL
  Filled 2015-06-05 (×3): qty 1

## 2015-06-05 NOTE — H&P (Signed)
Referring Physician:  [name] is an 74 y.o. male.                       Chief Complaint: Chest pain  HPI: 74 years old male with 2 day history of weakness numbness in right arm and jaw pain. He was feeling dizzy with low blood pressure in office. He has past history of hypertension, stroke and dyslipidemia.  Past Medical History  Diagnosis Date  . Hypertension   . Stroke   . Hyperlipidemia       Past Surgical History  Procedure Laterality Date  . Enucleation Right   . Ruptured globe exploration and repair Left     Family History  Problem Relation Age of Onset  . Hypertension Mother    Social History:  reports that he has been smoking Cigarettes.  He has a 60 pack-year smoking history. He has never used smokeless tobacco. He reports that he does not drink alcohol or use illicit drugs.  Allergies: No Known Allergies  Medications Prior to Admission  Medication Sig Dispense Refill  . aspirin EC 81 MG EC tablet Take 1 tablet (81 mg total) by mouth daily.    . clopidogrel (PLAVIX) 75 MG tablet Take 75 mg by mouth daily.    Marland Kitchen lisinopril-hydrochlorothiazide (PRINZIDE,ZESTORETIC) 10-12.5 MG per tablet Take 1 tablet by mouth daily.    . simvastatin (ZOCOR) 20 MG tablet Take 20 mg by mouth daily.      No results found for this or any previous visit (from the past 48 hour(s)). No results found.  Review Of Systems Constitutional: Negative for fever and chills.  HENT: Negative for sore throat and tinnitus.  Eyes: Negative for blurred vision and redness.  Respiratory: Negative for cough and shortness of breath.  Cardiovascular: Negative for chest pain, palpitations, orthopnea and PND.  Gastrointestinal: Negative for nausea, vomiting, abdominal pain and diarrhea.  Genitourinary: Negative for dysuria, urgency and frequency.  Musculoskeletal: Negative for myalgias and joint pain.  Skin: Negative for rash.  Neurological: Positive for dizziness, sensory change and focal weakness.  Negative for speech change and weakness.  Endo/Heme/Allergies: Does not bruise/bleed easily. Feels cold all the time according to family.  Psychiatric/Behavioral: Negative for depression and suicidal ideas.   Blood pressure 144/72, pulse 65, temperature 97.9 F (36.6 C), temperature source Oral, resp. rate 18, height '5\' 9"'$  (1.753 m), weight 58.786 kg (129 lb 9.6 oz), SpO2 100 %.  Physical Exam  Constitutional: He appears well-developed and well-nourished. No distress.  HENT: Normocephalic and atraumatic. Oropharynx is clear and moist.  Eyes: No scleral icterus. Left pupil is round and reactive. Right eye prosthetic.  Neck: Neck supple. No JVD present. No tracheal deviation present. No thyromegaly present.  Cardiovascular: Normal rate, regular rhythm, normal heart sounds and intact distal pulses. Exam reveals no gallop and no friction rub.II/VI systolic murmur. Respiratory: Effort normal and breath sounds normal.  GI: Soft. Bowel sounds are normal. He exhibits no distension. There is no tenderness.  Musculoskeletal: Normal range of motion. He exhibits no edema.  Lymphadenopathy: He has no cervical adenopathy.  Neurological: He is alert. No cranial nerve deficit.  Skin: Skin is cool and dry. No rash noted. No erythema.  Psychiatric: He has a normal mood and affect. His behavior is normal. Judgment and thought content normal.   Assessment/Plan Chest pain r/o MI Hypertension Stroke Cold intolerance  Admit, Monitor, Troponin-I, Home medications, Check TSH.  Terry Riddle, MD  06/05/2015, 6:16 PM

## 2015-06-05 NOTE — Progress Notes (Signed)
ANTICOAGULATION CONSULT NOTE - Initial Consult  Pharmacy Consult for heparin Indication: chest pain/ACS  No Known Allergies  Patient Measurements: Height: '5\' 9"'$  (175.3 cm) Weight: 129 lb 9.6 oz (58.786 kg) IBW/kg (Calculated) : 70.7 Heparin Dosing Weight: 58.8 kg  Vital Signs: Temp: 97.9 F (36.6 C) (09/21 1802) Temp Source: Oral (09/21 1802) BP: 144/72 mmHg (09/21 1802) Pulse Rate: 65 (09/21 1802)  Labs: No results for input(s): HGB, HCT, PLT, APTT, LABPROT, INR, HEPARINUNFRC, CREATININE, CKTOTAL, CKMB, TROPONINI in the last 72 hours.  CrCl cannot be calculated (Patient has no serum creatinine result on file.).   Medical History: Past Medical History  Diagnosis Date  . Hypertension   . Stroke   . Hyperlipidemia     Medications:  Scheduled:  . aspirin  324 mg Oral NOW   Or  . aspirin  300 mg Rectal NOW  . [START ON 06/06/2015] aspirin EC  81 mg Oral Daily  . [START ON 06/06/2015] clopidogrel  75 mg Oral Daily  . [START ON 06/06/2015] Influenza vac split quadrivalent PF  0.5 mL Intramuscular Tomorrow-1000  . simvastatin  20 mg Oral QHS  . sodium chloride  3 mL Intravenous Q12H   Infusions:    Assessment: 74 yo male with ACS will be started on heparin.  Historic renal function is good.  Not on any anticoagulation prior to admission.  Goal of Therapy:  Heparin level 0.3-0.7 units/ml Monitor platelets by anticoagulation protocol: Yes   Plan:  - heparin 3000 units iv bolus x1, then start heparin at 700 units/hr - 8 hr heparin level - daily heparin level and CBC  So, Tsz-Yin 06/05/2015,6:16 PM

## 2015-06-06 ENCOUNTER — Inpatient Hospital Stay (HOSPITAL_COMMUNITY): Payer: Medicare Other

## 2015-06-06 DIAGNOSIS — G459 Transient cerebral ischemic attack, unspecified: Secondary | ICD-10-CM | POA: Diagnosis not present

## 2015-06-06 LAB — LIPID PANEL
CHOLESTEROL: 125 mg/dL (ref 0–200)
HDL: 53 mg/dL (ref >40)
LDL Cholesterol: 59 mg/dL (ref 0–99)
Total CHOL/HDL Ratio: 2.4 RATIO
Triglycerides: 64 mg/dL (ref <150)
VLDL: 13 mg/dL (ref 0–40)

## 2015-06-06 LAB — CBC
HEMATOCRIT: 36 % — AB (ref 39.0–52.0)
Hemoglobin: 12.2 g/dL — ABNORMAL LOW (ref 13.0–17.0)
MCH: 31.8 pg (ref 26.0–34.0)
MCHC: 33.9 g/dL (ref 30.0–36.0)
MCV: 93.8 fL (ref 78.0–100.0)
Platelets: 216 10*3/uL (ref 150–400)
RBC: 3.84 MIL/uL — ABNORMAL LOW (ref 4.22–5.81)
RDW: 12.8 % (ref 11.5–15.5)
WBC: 4.7 10*3/uL (ref 4.0–10.5)

## 2015-06-06 LAB — BASIC METABOLIC PANEL
ANION GAP: 6 (ref 5–15)
BUN: 13 mg/dL (ref 6–20)
CALCIUM: 9 mg/dL (ref 8.9–10.3)
CO2: 31 mmol/L (ref 22–32)
Chloride: 98 mmol/L — ABNORMAL LOW (ref 101–111)
Creatinine, Ser: 0.89 mg/dL (ref 0.61–1.24)
GFR calc Af Amer: >60 mL/min (ref >60)
GLUCOSE: 102 mg/dL — AB (ref 65–99)
Potassium: 3.5 mmol/L (ref 3.5–5.1)
SODIUM: 135 mmol/L (ref 135–145)

## 2015-06-06 LAB — HEPARIN LEVEL (UNFRACTIONATED)
Heparin Unfractionated: 0.16 IU/mL — ABNORMAL LOW (ref 0.30–0.70)
Heparin Unfractionated: 0.38 IU/mL (ref 0.30–0.70)
Heparin Unfractionated: 0.45 IU/mL (ref 0.30–0.70)

## 2015-06-06 LAB — PROTIME-INR
INR: 1.19 (ref 0.00–1.49)
PROTHROMBIN TIME: 15.3 s — AB (ref 11.6–15.2)

## 2015-06-06 LAB — TROPONIN I

## 2015-06-06 MED ORDER — TIMOLOL MALEATE 0.5 % OP SOLG
1.0000 [drp] | Freq: Two times a day (BID) | OPHTHALMIC | Status: DC
  Administered 2015-06-06 – 2015-06-08 (×3): 1 [drp] via OPHTHALMIC
  Filled 2015-06-06 (×2): qty 5

## 2015-06-06 MED ORDER — HEPARIN BOLUS VIA INFUSION
1500.0000 [IU] | Freq: Once | INTRAVENOUS | Status: AC
  Administered 2015-06-06: 1500 [IU] via INTRAVENOUS
  Filled 2015-06-06: qty 1500

## 2015-06-06 NOTE — Progress Notes (Addendum)
ANTICOAGULATION CONSULT NOTE - Follow-up Consult  Pharmacy Consult for heparin Indication: chest pain/ACS  No Known Allergies  Patient Measurements: Height: '5\' 9"'$  (175.3 cm) Weight: 130 lb 14.4 oz (59.376 kg) (scale b) IBW/kg (Calculated) : 70.7 Heparin Dosing Weight: 58.8 kg  Vital Signs: Temp: 98.6 F (37 C) (09/22 1214) Temp Source: Oral (09/22 1214) BP: 95/57 mmHg (09/22 1214) Pulse Rate: 54 (09/22 1214)  Labs:  Recent Labs  06/05/15 1849 06/06/15 0028 06/06/15 0312 06/06/15 0649 06/06/15 1217  HGB 13.2  --  12.2*  --   --   HCT 37.8*  --  36.0*  --   --   PLT 229  --  216  --   --   LABPROT  --   --   --  15.3*  --   INR  --   --   --  1.19  --   HEPARINUNFRC  --   --  0.16*  --  0.38  CREATININE 0.81  --   --  0.89  --   TROPONINI <0.03 <0.03  --  <0.03  --     Estimated Creatinine Clearance: 62.1 mL/min (by C-G formula based on Cr of 0.89).   Assessment: 74 yo male on heparin for r/o ACS. Heparin level 0.38 (at goal) on 950 units/hr.   Goal of Therapy:  Heparin level 0.3-0.7 units/ml Monitor platelets by anticoagulation protocol: Yes   Plan:  -No heparin changes -Will recheck a heparin level today -Daily heparin level and CBC  Hildred Laser, Pharm D 06/06/2015 1:14 PM   ======================   Addendum: - confirmatory HL therapeutic - no bleeding documented   Plan: - Continue heparin gtt at 950 units/hr - F/U AM labs    Thuy D. Mina Marble, PharmD, BCPS Pager:  [phone] 06/06/2015, 9:45 PM

## 2015-06-06 NOTE — Progress Notes (Signed)
*  PRELIMINARY RESULTS* Echocardiogram 2D Echocardiogram has been performed.  Leavy Cella 06/06/2015, 12:57 PM

## 2015-06-06 NOTE — Progress Notes (Signed)
ANTICOAGULATION CONSULT NOTE - Follow-up Consult  Pharmacy Consult for heparin Indication: chest pain/ACS  No Known Allergies  Patient Measurements: Height: '5\' 9"'$  (175.3 cm) Weight: 129 lb 9.6 oz (58.786 kg) IBW/kg (Calculated) : 70.7 Heparin Dosing Weight: 58.8 kg  Vital Signs: Temp: 98.3 F (36.8 C) (09/22 0031) Temp Source: Oral (09/22 0031) BP: 110/62 mmHg (09/22 0031) Pulse Rate: 70 (09/22 0031)  Labs:  Recent Labs  06/05/15 1849 06/06/15 0028 06/06/15 0312  HGB 13.2  --  12.2*  HCT 37.8*  --  36.0*  PLT 229  --  216  HEPARINUNFRC  --   --  0.16*  CREATININE 0.81  --   --   TROPONINI <0.03 <0.03  --     Estimated Creatinine Clearance: 67.6 mL/min (by C-G formula based on Cr of 0.81).   Assessment: 74 yo male on heparin for r/o ACS. Heparin level 0.16 (subtherapeutic) on 700 units/hr. No issues with line or bleeding reported per RN. Hgb down some, plt ok.  Goal of Therapy:  Heparin level 0.3-0.7 units/ml Monitor platelets by anticoagulation protocol: Yes   Plan:  - Rebolus heparin 1500 units IV - Increase heparin gtt to 950 units/hr - 8 hr heparin level  Sherlon Handing, PharmD, BCPS Clinical pharmacist, pager [phone] 06/06/2015,4:19 AM

## 2015-06-07 ENCOUNTER — Encounter (HOSPITAL_COMMUNITY)
Admission: AD | Disposition: A | Discharge status: Home / Self Care | Payer: Self-pay | Source: Ambulatory Visit | Attending: Cardiovascular Disease

## 2015-06-07 ENCOUNTER — Inpatient Hospital Stay (HOSPITAL_COMMUNITY): Payer: Medicare Other

## 2015-06-07 HISTORY — PX: CARDIAC CATHETERIZATION: SHX172

## 2015-06-07 LAB — CBC
HCT: 34.4 % — ABNORMAL LOW (ref 39.0–52.0)
HEMOGLOBIN: 12 g/dL — AB (ref 13.0–17.0)
MCH: 32.9 pg (ref 26.0–34.0)
MCHC: 34.9 g/dL (ref 30.0–36.0)
MCV: 94.2 fL (ref 78.0–100.0)
PLATELETS: 205 10*3/uL (ref 150–400)
RBC: 3.65 MIL/uL — AB (ref 4.22–5.81)
RDW: 13 % (ref 11.5–15.5)
WBC: 4.9 10*3/uL (ref 4.0–10.5)

## 2015-06-07 LAB — HEPARIN LEVEL (UNFRACTIONATED): HEPARIN UNFRACTIONATED: 0.59 [IU]/mL (ref 0.30–0.70)

## 2015-06-07 SURGERY — LEFT HEART CATH AND CORONARY ANGIOGRAPHY
Anesthesia: Moderate Sedation

## 2015-06-07 MED ORDER — HEPARIN (PORCINE) IN NACL 2-0.9 UNIT/ML-% IJ SOLN
INTRAMUSCULAR | Status: AC
  Filled 2015-06-07: qty 1000

## 2015-06-07 MED ORDER — LIDOCAINE HCL (PF) 1 % IJ SOLN
INTRAMUSCULAR | Status: AC
  Filled 2015-06-07: qty 30

## 2015-06-07 MED ORDER — SODIUM CHLORIDE 0.9 % IJ SOLN
3.0000 mL | Freq: Two times a day (BID) | INTRAMUSCULAR | Status: DC
  Administered 2015-06-07: 3 mL via INTRAVENOUS

## 2015-06-07 MED ORDER — TECHNETIUM TC 99M SESTAMIBI - CARDIOLITE
30.0000 | Freq: Once | INTRAVENOUS | Status: AC | PRN
  Administered 2015-06-07: 11:00:00 30 via INTRAVENOUS

## 2015-06-07 MED ORDER — SODIUM CHLORIDE 0.9 % IV SOLN: INTRAVENOUS | Status: DC

## 2015-06-07 MED ORDER — TECHNETIUM TC 99M SESTAMIBI GENERIC - CARDIOLITE
10.0000 | Freq: Once | INTRAVENOUS | Status: AC | PRN
  Administered 2015-06-07: 10 via INTRAVENOUS

## 2015-06-07 MED ORDER — REGADENOSON 0.4 MG/5ML IV SOLN
INTRAVENOUS | Status: AC
  Administered 2015-06-07: 0.4 mg via INTRAVENOUS
  Filled 2015-06-07: qty 5

## 2015-06-07 MED ORDER — FENTANYL CITRATE (PF) 100 MCG/2ML IJ SOLN
INTRAMUSCULAR | Status: AC
  Filled 2015-06-07: qty 4

## 2015-06-07 MED ORDER — MIDAZOLAM HCL 2 MG/2ML IJ SOLN
INTRAMUSCULAR | Status: AC
  Filled 2015-06-07: qty 4

## 2015-06-07 MED ORDER — IOHEXOL 350 MG/ML SOLN
INTRAVENOUS | Status: DC | PRN
  Administered 2015-06-07: 50 mL via INTRACARDIAC

## 2015-06-07 MED ORDER — SODIUM CHLORIDE 0.9 % IV SOLN
INTRAVENOUS | Status: AC
  Administered 2015-06-07: 22:00:00 via INTRAVENOUS

## 2015-06-07 MED ORDER — LIDOCAINE HCL (PF) 1 % IJ SOLN
INTRAMUSCULAR | Status: DC | PRN
  Administered 2015-06-07: 19:00:00

## 2015-06-07 MED ORDER — SODIUM CHLORIDE 0.9 % IJ SOLN: 3.0000 mL | INTRAMUSCULAR | Status: DC | PRN

## 2015-06-07 MED ORDER — SODIUM CHLORIDE 0.9 % IV SOLN: 250.0000 mL | INTRAVENOUS | Status: DC | PRN

## 2015-06-07 MED ORDER — FENTANYL CITRATE (PF) 100 MCG/2ML IJ SOLN
INTRAMUSCULAR | Status: DC | PRN
Start: 2015-06-07 — End: 2015-06-07
  Administered 2015-06-07: 25 ug via INTRAVENOUS

## 2015-06-07 MED ORDER — MIDAZOLAM HCL 2 MG/2ML IJ SOLN
INTRAMUSCULAR | Status: DC | PRN
  Administered 2015-06-07: 1 mg via INTRAVENOUS

## 2015-06-07 MED ORDER — REGADENOSON 0.4 MG/5ML IV SOLN
0.4000 mg | Freq: Once | INTRAVENOUS | Status: AC
  Administered 2015-06-07: 0.4 mg via INTRAVENOUS
  Filled 2015-06-07: qty 5

## 2015-06-07 SURGICAL SUPPLY — 8 items
CATH INFINITI 5FR MULTPACK ANG (CATHETERS) ×2 IMPLANT
KIT HEART LEFT (KITS) ×2 IMPLANT
PACK CARDIAC CATHETERIZATION (CUSTOM PROCEDURE TRAY) ×2 IMPLANT
SHEATH PINNACLE 5F 10CM (SHEATH) ×2 IMPLANT
SYR MEDRAD MARK V 150ML (SYRINGE) ×2 IMPLANT
TRANSDUCER W/STOPCOCK (MISCELLANEOUS) ×2 IMPLANT
WIRE EMERALD 3MM-J .035X150CM (WIRE) ×2 IMPLANT
WIRE HI TORQ VERSACORE-J 145CM (WIRE) ×2 IMPLANT

## 2015-06-07 NOTE — Clinical Documentation Improvement (Signed)
Cardiology  Can chest pain be further specified?   GERD  CAD  Angina  Other  Clinically Undetermined  Document any associated diagnoses/conditions.   Supporting Information: Patient admitted with chief complaint of chest pain, r/o MI per 9/21 progress notes.    Please exercise your independent, professional judgment when responding. A specific answer is not anticipated or expected.   Thank You,  [place] [phone]

## 2015-06-07 NOTE — Progress Notes (Signed)
Pt transported off unit to MRI and nuclear med. Delia Heady RN

## 2015-06-07 NOTE — Care Management Important Message (Signed)
Important Message  Patient Details  Name: Terry Knox MRN: [id number] Date of Birth: 09-10-1941   Medicare Important Message Given:  Yes-second notification given    Loann Quill 06/07/2015, 10:44 AM

## 2015-06-07 NOTE — Progress Notes (Signed)
ANTICOAGULATION CONSULT NOTE - Follow-up Consult  Pharmacy Consult for heparin Indication: chest pain/ACS  No Known Allergies  Patient Measurements: Height: '5\' 9"'$  (175.3 cm) Weight: 132 lb (59.875 kg) (scale b) IBW/kg (Calculated) : 70.7 Heparin Dosing Weight: 58.8 kg  Vital Signs: Temp: 97.8 F (36.6 C) (09/23 1232) Temp Source: Oral (09/23 1232) BP: 148/63 mmHg (09/23 1232) Pulse Rate: 50 (09/23 1232)  Labs:  Recent Labs  06/05/15 1849 06/06/15 0028  06/06/15 0312 06/06/15 0649 06/06/15 1217 06/06/15 2106 06/07/15 0414  HGB 13.2  --   --  12.2*  --   --   --  12.0*  HCT 37.8*  --   --  36.0*  --   --   --  34.4*  PLT 229  --   --  216  --   --   --  205  LABPROT  --   --   --   --  15.3*  --   --   --   INR  --   --   --   --  1.19  --   --   --   HEPARINUNFRC  --   --   < > 0.16*  --  0.38 0.45 0.59  CREATININE 0.81  --   --   --  0.89  --   --   --   TROPONINI <0.03 <0.03  --   --  <0.03  --   --   --   < > = values in this interval not displayed.  Estimated Creatinine Clearance: 62.6 mL/min (by C-G formula based on Cr of 0.89).   Assessment: 74 yo male on heparin for r/o ACS. Heparin therapeutic on current rate. Nuc med this AM. Cont heparin for now.   Goal of Therapy:  Heparin level 0.3-0.7 units/ml Monitor platelets by anticoagulation protocol: Yes   Plan:   Cont heparin at 950 units/hr Daily heparin level and CBC  Onnie Boer, PharmD Pager: [phone] 06/07/2015 1:01 PM

## 2015-06-07 NOTE — Progress Notes (Signed)
Pt back from procedure to room. Delia Heady RN

## 2015-06-07 NOTE — Interval H&P Note (Signed)
History and Physical Interval Note:  06/07/2015 6:08 PM  Terry Knox  has presented today for surgery, with the diagnosis of CP/Abnormal Stress test  The various methods of treatment have been discussed with the patient and family. After consideration of risks, benefits and other options for treatment, the patient has consented to  Procedure(s): Left Heart Cath and Coronary Angiography (N/A) as a surgical intervention .  The patient's history has been reviewed, patient examined, no change in status, stable for surgery.  I have reviewed the patient's chart and labs.  Questions were answered to the patient's satisfaction.     KADAKIA,AJAY S

## 2015-06-08 ENCOUNTER — Inpatient Hospital Stay (HOSPITAL_COMMUNITY): Payer: Medicare Other

## 2015-06-08 LAB — BASIC METABOLIC PANEL
ANION GAP: 6 (ref 5–15)
BUN: 13 mg/dL (ref 6–20)
CALCIUM: 8.7 mg/dL — AB (ref 8.9–10.3)
CHLORIDE: 101 mmol/L (ref 101–111)
CO2: 29 mmol/L (ref 22–32)
Creatinine, Ser: 0.72 mg/dL (ref 0.61–1.24)
GFR calc non Af Amer: >60 mL/min (ref >60)
Glucose, Bld: 108 mg/dL — ABNORMAL HIGH (ref 65–99)
POTASSIUM: 3.4 mmol/L — AB (ref 3.5–5.1)
Sodium: 136 mmol/L (ref 135–145)

## 2015-06-08 LAB — CBC
HEMATOCRIT: 35 % — AB (ref 39.0–52.0)
Hemoglobin: 12.3 g/dL — ABNORMAL LOW (ref 13.0–17.0)
MCH: 33 pg (ref 26.0–34.0)
MCHC: 35.1 g/dL (ref 30.0–36.0)
MCV: 93.8 fL (ref 78.0–100.0)
PLATELETS: 192 10*3/uL (ref 150–400)
RBC: 3.73 MIL/uL — ABNORMAL LOW (ref 4.22–5.81)
RDW: 13 % (ref 11.5–15.5)
WBC: 3.7 10*3/uL — AB (ref 4.0–10.5)

## 2015-06-08 LAB — HEPARIN LEVEL (UNFRACTIONATED)

## 2015-06-08 MED ORDER — LISINOPRIL 5 MG PO TABS: 5.0000 mg | ORAL_TABLET | Freq: Every day | ORAL | Status: DC

## 2015-06-08 NOTE — Discharge Summary (Signed)
Physician Discharge Summary  Patient ID: Terry Knox MRN: [id number] DOB/AGE: 06/10/1941 74 y.o.  Admit date: 06/05/2015 Discharge date: 06/08/2015  Admission Diagnoses: Chest pain r/o MI Hypertension Stroke Cold intolerance  Discharge Diagnoses:  Principal Problem: * TIA ( transient ischemic attack ) * Active Problems: Musculoskeletal chest pain Normal coronary arteries Hypertension Old stroke Artificial right eye Right shoulder pain Glaucoma of left eye  Discharged Condition: fair  Hospital Course: 74 years old male with 2 day history of weakness numbness in right arm and jaw pain. He was feeling dizzy with low blood pressure in office. He has past history of hypertension, stroke and dyslipidemia. His MRI/MRA brain was suggestive of microvascular ischemic disease. His nuclear stress test was positive for moderate area of lateral wall ischemia however his cardiac cath showed normal coronaries and normal LV systolic function. His HCTZ was discontinued and lisinopril was decreased by 50 %. He will continue aspirin and plavix as before. He will be followed by me in 1 week.  Consults: cardiology  Significant Diagnostic Studies: labs: CBC was unremarkable, CMET showed mild hyperglycemia. Troponin I and TSH were normal. Lipid levels were excellent with LDL of 59 and HDL of 53 and triglyceride of 64 mg/dL.   EKG-SR, 1st. Degree AV block and possible septal infarct.  Echocardiogram showed normal LV systolic function with mild LVH and mild diastolic dysfunction.  Chest x-ray was unremarkable.  Coronary angiogram was normal.  Nuclear stress was falsely positive for lateral was ischemia.  Treatments: cardiac meds: lisinopril (Prinivil), aspirin, plavix, potassium, and Simvastatin.  Discharge Exam: Blood pressure 134/59, pulse 78, temperature 97.9 F (36.6 C), temperature source Oral, resp. rate 18, height '5\' 9"'$  (1.753 m), weight 58.287 kg (128 lb 8 oz), SpO2 98 %. Physical Exam   Constitutional: He appears well-developed and well-nourished. No distress.  HENT: Normocephalic and atraumatic. Oropharynx is clear and moist.  Eyes: No scleral icterus. Left pupil is round and reactive. Right eye prosthetic.  Neck: Neck supple. No JVD present. No tracheal deviation present. No thyromegaly present.  Cardiovascular: Normal rate, regular rhythm, normal heart sounds and intact distal pulses. Exam reveals no gallop and no friction rub.II/VI systolic murmur. Respiratory: Effort normal and breath sounds normal.  GI: Soft. Bowel sounds are normal. He exhibits no distension. There is no tenderness.  Musculoskeletal: Normal range of motion. He exhibits no edema.  Lymphadenopathy: He has no cervical adenopathy.  Neurological: He is alert. No cranial nerve deficit.  Skin: Skin is cool and dry. No rash noted. No erythema.  Psychiatric: He has a normal mood and affect. His behavior is normal. Judgment and thought content normal.   Disposition: 01-Home or Self Care     Medication List    STOP taking these medications        lisinopril-hydrochlorothiazide 10-12.5 MG per tablet  Commonly known as:  PRINZIDE,ZESTORETIC      TAKE these medications        aspirin 81 MG EC tablet  Take 1 tablet (81 mg total) by mouth daily.     clopidogrel 75 MG tablet  Commonly known as:  PLAVIX  Take 75 mg by mouth daily.     KLOR-CON M10 10 MEQ tablet  Generic drug:  potassium chloride  Take 10 mEq by mouth daily.     lisinopril 5 MG tablet  Commonly known as:  ZESTRIL  Take 1 tablet (5 mg total) by mouth daily.     simvastatin 20 MG tablet  Commonly known as:  ZOCOR  Take 20 mg by mouth daily at 6 PM.     timolol 0.5 % ophthalmic gel-forming  Commonly known as:  TIMOPTIC-XR  Place 1 drop into the left eye 2 (two) times daily.           Follow-up Information    Follow up with [facility], MD. Schedule an appointment as soon as possible for a visit in 1 week.    Specialty:  Cardiology   Contact information:   [place] [address] [phone]       Signed: Birdie Riddle 06/08/2015, 12:40 PM

## 2015-06-08 NOTE — Progress Notes (Signed)
All d/c instructions explained as given to pt.  Verbalized understanding.D/c off floor via w/c to awaiting transport.  Karie Kirks, Therapist, sports.

## 2015-06-10 ENCOUNTER — Encounter (HOSPITAL_COMMUNITY): Payer: Self-pay | Admitting: Cardiovascular Disease

## 2015-06-10 LAB — POCT ACTIVATED CLOTTING TIME: ACTIVATED CLOTTING TIME: 128 s

## 2016-07-07 ENCOUNTER — Ambulatory Visit
Admission: RE | Admit: 2016-07-07 | Discharge: 2016-07-07 | Disposition: A | Discharge status: Home / Self Care | Payer: Medicare Other | Source: Ambulatory Visit | Attending: Cardiovascular Disease | Admitting: Cardiovascular Disease

## 2016-07-07 ENCOUNTER — Other Ambulatory Visit: Payer: Self-pay | Admitting: Cardiovascular Disease

## 2016-07-07 DIAGNOSIS — M25552 Pain in left hip: Secondary | ICD-10-CM

## 2016-10-27 DIAGNOSIS — H401123 Primary open-angle glaucoma, left eye, severe stage: Secondary | ICD-10-CM | POA: Diagnosis not present

## 2016-11-17 DIAGNOSIS — I10 Essential (primary) hypertension: Secondary | ICD-10-CM | POA: Diagnosis not present

## 2016-11-17 DIAGNOSIS — F17210 Nicotine dependence, cigarettes, uncomplicated: Secondary | ICD-10-CM | POA: Diagnosis not present

## 2016-11-17 DIAGNOSIS — E785 Hyperlipidemia, unspecified: Secondary | ICD-10-CM | POA: Diagnosis not present

## 2016-11-17 DIAGNOSIS — M25562 Pain in left knee: Secondary | ICD-10-CM | POA: Diagnosis not present

## 2016-12-17 DIAGNOSIS — M25562 Pain in left knee: Secondary | ICD-10-CM | POA: Diagnosis not present

## 2016-12-17 DIAGNOSIS — E785 Hyperlipidemia, unspecified: Secondary | ICD-10-CM | POA: Diagnosis not present

## 2016-12-17 DIAGNOSIS — I10 Essential (primary) hypertension: Secondary | ICD-10-CM | POA: Diagnosis not present

## 2016-12-17 DIAGNOSIS — F17210 Nicotine dependence, cigarettes, uncomplicated: Secondary | ICD-10-CM | POA: Diagnosis not present

## 2017-01-18 DIAGNOSIS — I10 Essential (primary) hypertension: Secondary | ICD-10-CM | POA: Diagnosis not present

## 2017-01-18 DIAGNOSIS — F17210 Nicotine dependence, cigarettes, uncomplicated: Secondary | ICD-10-CM | POA: Diagnosis not present

## 2017-01-18 DIAGNOSIS — N429 Disorder of prostate, unspecified: Secondary | ICD-10-CM | POA: Diagnosis not present

## 2017-01-18 DIAGNOSIS — E876 Hypokalemia: Secondary | ICD-10-CM | POA: Diagnosis not present

## 2017-01-18 DIAGNOSIS — E559 Vitamin D deficiency, unspecified: Secondary | ICD-10-CM | POA: Diagnosis not present

## 2017-01-18 DIAGNOSIS — E785 Hyperlipidemia, unspecified: Secondary | ICD-10-CM | POA: Diagnosis not present

## 2017-01-18 DIAGNOSIS — Z79899 Other long term (current) drug therapy: Secondary | ICD-10-CM | POA: Diagnosis not present

## 2017-01-18 DIAGNOSIS — M25562 Pain in left knee: Secondary | ICD-10-CM | POA: Diagnosis not present

## 2017-01-18 DIAGNOSIS — D649 Anemia, unspecified: Secondary | ICD-10-CM | POA: Diagnosis not present

## 2017-01-26 DIAGNOSIS — Z961 Presence of intraocular lens: Secondary | ICD-10-CM | POA: Diagnosis not present

## 2017-01-26 DIAGNOSIS — H401123 Primary open-angle glaucoma, left eye, severe stage: Secondary | ICD-10-CM | POA: Diagnosis not present

## 2017-01-26 DIAGNOSIS — H31012 Macula scars of posterior pole (postinflammatory) (post-traumatic), left eye: Secondary | ICD-10-CM | POA: Diagnosis not present

## 2017-02-02 DIAGNOSIS — E785 Hyperlipidemia, unspecified: Secondary | ICD-10-CM | POA: Diagnosis not present

## 2017-02-02 DIAGNOSIS — I2510 Atherosclerotic heart disease of native coronary artery without angina pectoris: Secondary | ICD-10-CM | POA: Diagnosis not present

## 2017-02-02 DIAGNOSIS — M25562 Pain in left knee: Secondary | ICD-10-CM | POA: Diagnosis not present

## 2017-02-02 DIAGNOSIS — F17210 Nicotine dependence, cigarettes, uncomplicated: Secondary | ICD-10-CM | POA: Diagnosis not present

## 2017-05-05 DIAGNOSIS — E785 Hyperlipidemia, unspecified: Secondary | ICD-10-CM | POA: Diagnosis not present

## 2017-05-05 DIAGNOSIS — F17210 Nicotine dependence, cigarettes, uncomplicated: Secondary | ICD-10-CM | POA: Diagnosis not present

## 2017-05-05 DIAGNOSIS — M25562 Pain in left knee: Secondary | ICD-10-CM | POA: Diagnosis not present

## 2017-05-05 DIAGNOSIS — I2510 Atherosclerotic heart disease of native coronary artery without angina pectoris: Secondary | ICD-10-CM | POA: Diagnosis not present

## 2017-09-16 DIAGNOSIS — F17210 Nicotine dependence, cigarettes, uncomplicated: Secondary | ICD-10-CM | POA: Diagnosis not present

## 2017-09-16 DIAGNOSIS — M25562 Pain in left knee: Secondary | ICD-10-CM | POA: Diagnosis not present

## 2017-09-16 DIAGNOSIS — E785 Hyperlipidemia, unspecified: Secondary | ICD-10-CM | POA: Diagnosis not present

## 2017-09-16 DIAGNOSIS — I2510 Atherosclerotic heart disease of native coronary artery without angina pectoris: Secondary | ICD-10-CM | POA: Diagnosis not present

## 2017-10-05 DIAGNOSIS — H401133 Primary open-angle glaucoma, bilateral, severe stage: Secondary | ICD-10-CM | POA: Diagnosis not present

## 2017-11-23 DIAGNOSIS — I10 Essential (primary) hypertension: Secondary | ICD-10-CM | POA: Diagnosis not present

## 2017-11-23 DIAGNOSIS — M25562 Pain in left knee: Secondary | ICD-10-CM | POA: Diagnosis not present

## 2017-11-23 DIAGNOSIS — F17210 Nicotine dependence, cigarettes, uncomplicated: Secondary | ICD-10-CM | POA: Diagnosis not present

## 2017-11-23 DIAGNOSIS — E7849 Other hyperlipidemia: Secondary | ICD-10-CM | POA: Diagnosis not present

## 2018-01-25 DIAGNOSIS — H5212 Myopia, left eye: Secondary | ICD-10-CM | POA: Diagnosis not present

## 2018-01-25 DIAGNOSIS — H31012 Macula scars of posterior pole (postinflammatory) (post-traumatic), left eye: Secondary | ICD-10-CM | POA: Diagnosis not present

## 2018-01-25 DIAGNOSIS — Z970 Presence of artificial eye: Secondary | ICD-10-CM | POA: Diagnosis not present

## 2018-01-25 DIAGNOSIS — H524 Presbyopia: Secondary | ICD-10-CM | POA: Diagnosis not present

## 2018-01-25 DIAGNOSIS — H401133 Primary open-angle glaucoma, bilateral, severe stage: Secondary | ICD-10-CM | POA: Diagnosis not present

## 2018-01-25 DIAGNOSIS — Z961 Presence of intraocular lens: Secondary | ICD-10-CM | POA: Diagnosis not present

## 2018-01-25 DIAGNOSIS — H52202 Unspecified astigmatism, left eye: Secondary | ICD-10-CM | POA: Diagnosis not present

## 2018-03-09 DIAGNOSIS — I10 Essential (primary) hypertension: Secondary | ICD-10-CM | POA: Diagnosis not present

## 2018-03-09 DIAGNOSIS — I2510 Atherosclerotic heart disease of native coronary artery without angina pectoris: Secondary | ICD-10-CM | POA: Diagnosis not present

## 2018-03-09 DIAGNOSIS — F17210 Nicotine dependence, cigarettes, uncomplicated: Secondary | ICD-10-CM | POA: Diagnosis not present

## 2018-03-09 DIAGNOSIS — M25562 Pain in left knee: Secondary | ICD-10-CM | POA: Diagnosis not present

## 2018-03-24 DIAGNOSIS — E876 Hypokalemia: Secondary | ICD-10-CM | POA: Diagnosis not present

## 2018-03-24 DIAGNOSIS — E559 Vitamin D deficiency, unspecified: Secondary | ICD-10-CM | POA: Diagnosis not present

## 2018-03-24 DIAGNOSIS — Z79899 Other long term (current) drug therapy: Secondary | ICD-10-CM | POA: Diagnosis not present

## 2018-03-24 DIAGNOSIS — E785 Hyperlipidemia, unspecified: Secondary | ICD-10-CM | POA: Diagnosis not present

## 2018-03-24 DIAGNOSIS — N429 Disorder of prostate, unspecified: Secondary | ICD-10-CM | POA: Diagnosis not present

## 2018-03-24 DIAGNOSIS — D649 Anemia, unspecified: Secondary | ICD-10-CM | POA: Diagnosis not present

## 2018-05-10 DIAGNOSIS — H401123 Primary open-angle glaucoma, left eye, severe stage: Secondary | ICD-10-CM | POA: Diagnosis not present

## 2018-06-09 DIAGNOSIS — I10 Essential (primary) hypertension: Secondary | ICD-10-CM | POA: Diagnosis not present

## 2018-06-09 DIAGNOSIS — I2510 Atherosclerotic heart disease of native coronary artery without angina pectoris: Secondary | ICD-10-CM | POA: Diagnosis not present

## 2018-06-09 DIAGNOSIS — M25562 Pain in left knee: Secondary | ICD-10-CM | POA: Diagnosis not present

## 2018-06-09 DIAGNOSIS — F17210 Nicotine dependence, cigarettes, uncomplicated: Secondary | ICD-10-CM | POA: Diagnosis not present

## 2018-08-17 DIAGNOSIS — F17210 Nicotine dependence, cigarettes, uncomplicated: Secondary | ICD-10-CM | POA: Diagnosis not present

## 2018-08-17 DIAGNOSIS — I10 Essential (primary) hypertension: Secondary | ICD-10-CM | POA: Diagnosis not present

## 2018-08-17 DIAGNOSIS — J208 Acute bronchitis due to other specified organisms: Secondary | ICD-10-CM | POA: Diagnosis not present

## 2018-08-17 DIAGNOSIS — E7849 Other hyperlipidemia: Secondary | ICD-10-CM | POA: Diagnosis not present

## 2019-02-03 DIAGNOSIS — H35342 Macular cyst, hole, or pseudohole, left eye: Secondary | ICD-10-CM | POA: Diagnosis not present

## 2019-02-03 DIAGNOSIS — H401123 Primary open-angle glaucoma, left eye, severe stage: Secondary | ICD-10-CM | POA: Diagnosis not present

## 2019-02-07 DIAGNOSIS — H401123 Primary open-angle glaucoma, left eye, severe stage: Secondary | ICD-10-CM | POA: Diagnosis not present

## 2019-02-07 DIAGNOSIS — H353124 Nonexudative age-related macular degeneration, left eye, advanced atrophic with subfoveal involvement: Secondary | ICD-10-CM | POA: Diagnosis not present

## 2019-02-07 DIAGNOSIS — H5440 Blindness, one eye, unspecified eye: Secondary | ICD-10-CM | POA: Diagnosis not present

## 2019-02-07 DIAGNOSIS — H35342 Macular cyst, hole, or pseudohole, left eye: Secondary | ICD-10-CM | POA: Diagnosis not present

## 2019-02-21 DIAGNOSIS — H353124 Nonexudative age-related macular degeneration, left eye, advanced atrophic with subfoveal involvement: Secondary | ICD-10-CM | POA: Diagnosis not present

## 2019-02-21 DIAGNOSIS — H35342 Macular cyst, hole, or pseudohole, left eye: Secondary | ICD-10-CM | POA: Diagnosis not present

## 2019-02-21 DIAGNOSIS — H401123 Primary open-angle glaucoma, left eye, severe stage: Secondary | ICD-10-CM | POA: Diagnosis not present

## 2019-02-21 DIAGNOSIS — H5440 Blindness, one eye, unspecified eye: Secondary | ICD-10-CM | POA: Diagnosis not present

## 2019-03-17 ENCOUNTER — Other Ambulatory Visit: Payer: Self-pay

## 2019-03-17 ENCOUNTER — Emergency Department (HOSPITAL_COMMUNITY): Payer: Medicare HMO

## 2019-03-17 ENCOUNTER — Emergency Department (HOSPITAL_COMMUNITY)
Admission: EM | Admit: 2019-03-17 | Discharge: 2019-03-17 | Disposition: A | Discharge status: Home / Self Care | Payer: Medicare HMO | Attending: Emergency Medicine | Admitting: Emergency Medicine

## 2019-03-17 ENCOUNTER — Encounter (HOSPITAL_COMMUNITY): Payer: Self-pay

## 2019-03-17 DIAGNOSIS — Z7982 Long term (current) use of aspirin: Secondary | ICD-10-CM | POA: Diagnosis not present

## 2019-03-17 DIAGNOSIS — J189 Pneumonia, unspecified organism: Secondary | ICD-10-CM | POA: Diagnosis not present

## 2019-03-17 DIAGNOSIS — R059 Cough, unspecified: Secondary | ICD-10-CM

## 2019-03-17 DIAGNOSIS — Z20828 Contact with and (suspected) exposure to other viral communicable diseases: Secondary | ICD-10-CM | POA: Diagnosis not present

## 2019-03-17 DIAGNOSIS — Z7902 Long term (current) use of antithrombotics/antiplatelets: Secondary | ICD-10-CM | POA: Insufficient documentation

## 2019-03-17 DIAGNOSIS — I10 Essential (primary) hypertension: Secondary | ICD-10-CM | POA: Insufficient documentation

## 2019-03-17 DIAGNOSIS — F17210 Nicotine dependence, cigarettes, uncomplicated: Secondary | ICD-10-CM | POA: Insufficient documentation

## 2019-03-17 DIAGNOSIS — J181 Lobar pneumonia, unspecified organism: Secondary | ICD-10-CM | POA: Insufficient documentation

## 2019-03-17 DIAGNOSIS — R05 Cough: Secondary | ICD-10-CM

## 2019-03-17 DIAGNOSIS — R079 Chest pain, unspecified: Secondary | ICD-10-CM | POA: Diagnosis not present

## 2019-03-17 DIAGNOSIS — Z79899 Other long term (current) drug therapy: Secondary | ICD-10-CM | POA: Diagnosis not present

## 2019-03-17 LAB — CBC WITH DIFFERENTIAL/PLATELET
Abs Immature Granulocytes: 0.01 10*3/uL (ref 0.00–0.07)
Basophils Absolute: 0 10*3/uL (ref 0.0–0.1)
Basophils Relative: 1 %
Eosinophils Absolute: 0.2 10*3/uL (ref 0.0–0.5)
Eosinophils Relative: 4 %
HCT: 35.7 % — ABNORMAL LOW (ref 39.0–52.0)
Hemoglobin: 11.8 g/dL — ABNORMAL LOW (ref 13.0–17.0)
Immature Granulocytes: 0 %
Lymphocytes Relative: 19 %
Lymphs Abs: 1.1 10*3/uL (ref 0.7–4.0)
MCH: 33.4 pg (ref 26.0–34.0)
MCHC: 33.1 g/dL (ref 30.0–36.0)
MCV: 101.1 fL — ABNORMAL HIGH (ref 80.0–100.0)
Monocytes Absolute: 0.6 10*3/uL (ref 0.1–1.0)
Monocytes Relative: 9 %
Neutro Abs: 4.1 10*3/uL (ref 1.7–7.7)
Neutrophils Relative %: 67 %
Platelets: 272 10*3/uL (ref 150–400)
RBC: 3.53 MIL/uL — ABNORMAL LOW (ref 4.22–5.81)
RDW: 12.5 % (ref 11.5–15.5)
WBC: 6.1 10*3/uL (ref 4.0–10.5)
nRBC: 0 % (ref 0.0–0.2)

## 2019-03-17 LAB — COMPREHENSIVE METABOLIC PANEL
ALT: 17 U/L (ref 0–44)
AST: 18 U/L (ref 15–41)
Albumin: 3.5 g/dL (ref 3.5–5.0)
Alkaline Phosphatase: 53 U/L (ref 38–126)
Anion gap: 8 (ref 5–15)
BUN: 11 mg/dL (ref 8–23)
CO2: 28 mmol/L (ref 22–32)
Calcium: 9 mg/dL (ref 8.9–10.3)
Chloride: 101 mmol/L (ref 98–111)
Creatinine, Ser: 0.66 mg/dL (ref 0.61–1.24)
GFR calc Af Amer: >60 mL/min (ref >60)
GFR calc non Af Amer: >60 mL/min (ref >60)
Glucose, Bld: 102 mg/dL — ABNORMAL HIGH (ref 70–99)
Potassium: 3.7 mmol/L (ref 3.5–5.1)
Sodium: 137 mmol/L (ref 135–145)
Total Bilirubin: 0.5 mg/dL (ref 0.3–1.2)
Total Protein: 7.8 g/dL (ref 6.5–8.1)

## 2019-03-17 LAB — TROPONIN I (HIGH SENSITIVITY)
Troponin I (High Sensitivity): 6 ng/L (ref <18)
Troponin I (High Sensitivity): 6 ng/L (ref <18)

## 2019-03-17 LAB — SARS CORONAVIRUS 2 BY RT PCR (HOSPITAL ORDER, PERFORMED IN ~~LOC~~ HOSPITAL LAB): SARS Coronavirus 2: NEGATIVE

## 2019-03-17 MED ORDER — DOXYCYCLINE HYCLATE 100 MG PO CAPS
100.0000 mg | ORAL_CAPSULE | Freq: Two times a day (BID) | ORAL | 0 refills | Status: AC
Start: 2019-03-17 — End: 2019-03-24

## 2019-03-17 MED ORDER — DOXYCYCLINE HYCLATE 100 MG PO TABS
100.0000 mg | ORAL_TABLET | Freq: Once | ORAL | Status: AC
  Administered 2019-03-17: 100 mg via ORAL
  Filled 2019-03-17: qty 1

## 2019-03-17 NOTE — ED Triage Notes (Signed)
Patient c/o a productive cough with white sputum  And right rib cage pain x 4 days.

## 2019-03-17 NOTE — Discharge Instructions (Signed)
Your work-up today of the cough and right lateral chest pain is consistent with pneumonia which we discovered on x-ray.  Other labs reassuring and your oxygen did not drop.  Your coronavirus test was negative.  We feel you are safe for discharge home for outpatient antibiotic therapy of the pneumonia.  If any symptoms change or worsen including your breathing gets worse, please return to the nearest emergency department as pneumonias can progress and require admission.  Please follow-up with your primary doctor in the neck several days for reassessment and further management.

## 2019-03-17 NOTE — ED Provider Notes (Signed)
[facility] DEPT Provider Note   CSN: [id number] Arrival date & time: 03/17/19  7893     History   Chief Complaint Chief Complaint  Patient presents with  . Cough  . rib cage pain    HPI Terry Knox is a 78 y.o. male.     The history is provided by the patient and medical records. No language interpreter was used.  Cough Cough characteristics:  Productive Sputum characteristics:  White Severity:  Severe Onset quality:  Gradual Duration:  4 days Timing:  Constant Progression:  Waxing and waning Chronicity:  New Smoker: yes   Relieved by:  Nothing Worsened by:  Nothing (cough) Ineffective treatments:  None tried Associated symptoms: chest pain (r lateral)   Associated symptoms: no chills, no diaphoresis, no fever, no headaches, no rash, no rhinorrhea, no shortness of breath, no sinus congestion, no weight loss and no wheezing     Past Medical History:  Diagnosis Date  . Hyperlipidemia   . Hypertension   . Stroke [facility])     Patient Active Problem List   Diagnosis Date Noted  . Chest pain at rest 06/05/2015  . TIA (transient ischemic attack) 03/16/2015    Past Surgical History:  Procedure Laterality Date  . CARDIAC CATHETERIZATION N/A 06/07/2015   Procedure: Left Heart Cath and Coronary Angiography;  Surgeon: Dixie Dials, MD;  Location: [place] [facility];  Service: Cardiovascular;  Laterality: N/A;  . ENUCLEATION Right   . RUPTURED GLOBE EXPLORATION AND REPAIR Left         Home Medications    Prior to Admission medications   Medication Sig Start Date End Date Taking? Authorizing Provider  aspirin EC 81 MG EC tablet Take 1 tablet (81 mg total) by mouth daily. 03/16/15   Hillary Bow, MD  clopidogrel (PLAVIX) 75 MG tablet Take 75 mg by mouth daily.    [provider]  KLOR-CON M10 10 MEQ tablet Take 10 mEq by mouth daily. 04/05/15   [provider]  lisinopril (PRINIVIL,ZESTRIL) 5 MG tablet Take 1 tablet (5  mg total) by mouth daily. 06/08/15   Dixie Dials, MD  simvastatin (ZOCOR) 20 MG tablet Take 20 mg by mouth daily at 6 PM.     [provider]  timolol (TIMOPTIC-XR) 0.5 % ophthalmic gel-forming Place 1 drop into the left eye 2 (two) times daily. 04/05/15   [provider]    Family History Family History  Problem Relation Age of Onset  . Hypertension Mother     Social History Social History   Tobacco Use  . Smoking status: Current Every Day Smoker    Packs/day: 1.00    Years: 60.00    Pack years: 60.00    Types: Cigarettes  . Smokeless tobacco: Never Used  Substance Use Topics  . Alcohol use: No    Alcohol/week: 0.0 standard drinks  . Drug use: No     Allergies   Patient has no known allergies.   Review of Systems Review of Systems  Constitutional: Negative for chills, diaphoresis, fatigue, fever and weight loss.  HENT: Negative for congestion and rhinorrhea.   Eyes: Negative for visual disturbance.  Respiratory: Positive for cough. Negative for chest tightness, shortness of breath, wheezing and stridor.   Cardiovascular: Positive for chest pain (r lateral). Negative for palpitations.  Gastrointestinal: Negative for abdominal pain, constipation, diarrhea, nausea and vomiting.  Genitourinary: Negative for flank pain.  Musculoskeletal: Negative for back pain, neck pain and neck stiffness.  Skin: Negative for rash and wound.  Neurological: Negative for light-headedness and headaches.  Psychiatric/Behavioral: Negative for agitation.  All other systems reviewed and are negative.    Physical Exam Updated Vital Signs BP (!) 147/70 (BP Location: Left Arm)   Pulse (!) 58   Temp 97.8 F (36.6 C) (Oral)   Resp 16   Ht 5\' 9"  (1.753 m)   Wt 65.8 kg   SpO2 100%   BMI 21.41 kg/m   Physical Exam Vitals signs and nursing note reviewed.  Constitutional:      General: He is not in acute distress.    Appearance: He is well-developed. He is not  ill-appearing, toxic-appearing or diaphoretic.  HENT:     Head: Normocephalic and atraumatic.     Nose: No congestion or rhinorrhea.     Mouth/Throat:     Mouth: Mucous membranes are moist.     Pharynx: No oropharyngeal exudate or posterior oropharyngeal erythema.  Eyes:     Conjunctiva/sclera: Conjunctivae normal.     Pupils: Pupils are equal, round, and reactive to light.  Neck:     Musculoskeletal: Neck supple. No muscular tenderness.  Cardiovascular:     Rate and Rhythm: Normal rate and regular rhythm.     Pulses: Normal pulses.     Heart sounds: No murmur.  Pulmonary:     Effort: Pulmonary effort is normal. No respiratory distress.     Breath sounds: Normal breath sounds. No wheezing, rhonchi or rales.  Chest:     Chest wall: No tenderness.  Abdominal:     Palpations: Abdomen is soft.     Tenderness: There is no abdominal tenderness.  Musculoskeletal:        General: No tenderness.     Right lower leg: No edema.     Left lower leg: No edema.  Skin:    General: Skin is warm and dry.     Capillary Refill: Capillary refill takes less than 2 seconds.     Findings: No erythema.  Neurological:     General: No focal deficit present.     Mental Status: He is alert.  Psychiatric:        Mood and Affect: Mood normal.      ED Treatments / Results  Labs (all labs ordered are listed, but only abnormal results are displayed) Labs Reviewed  CBC WITH DIFFERENTIAL/PLATELET - Abnormal; Notable for the following components:      Result Value   RBC 3.53 (*)    Hemoglobin 11.8 (*)    HCT 35.7 (*)    MCV 101.1 (*)    All other components within normal limits  COMPREHENSIVE METABOLIC PANEL - Abnormal; Notable for the following components:   Glucose, Bld 102 (*)    All other components within normal limits  SARS CORONAVIRUS 2 (HOSPITAL ORDER, [place] [facility])  CULTURE, BLOOD (ROUTINE X 2)  CULTURE, BLOOD (ROUTINE X 2)  TROPONIN I (HIGH SENSITIVITY)   TROPONIN I (HIGH SENSITIVITY)    EKG EKG Interpretation  Date/Time:  Friday March 17 2019 09:38:16 EDT Ventricular Rate:  52 PR Interval:    QRS Duration: 84 QT Interval:  450 QTC Calculation: 419 R Axis:   -47 Text Interpretation:  Sinus rhythm Left axis deviation Low voltage, precordial leads Probable anteroseptal infarct, old When compared to prior, no significant changes seen.  No STEMI Confirmed by Antony Blackbird [phone]) on 03/17/2019 1:00:31 PM   Radiology Dg Chest Portable 1 View  Result Date:  03/17/2019 CLINICAL DATA:  Cough, right chest pain EXAM: PORTABLE CHEST 1 VIEW COMPARISON:  06/08/2015 FINDINGS: Similar hyperinflation/hyperlucency compatible with COPD/emphysema. Skin folds overlie the chest bilaterally. Negative for edema, effusion or pneumothorax. Trachea is midline. Increased medial right upper lobe paratracheal parenchymal opacity may represent progressive lung scarring. Difficult to exclude superimposed pneumonia. IMPRESSION: Chronic COPD/emphysema Increased medial right upper lobe opacity compatible with progressive parenchymal scarring or pneumonia/aspiration. Electronically Signed   By: Jerilynn Mages.  Shick M.D.   On: 03/17/2019 09:38    Procedures Procedures (including critical care time)  Terry Knox was evaluated in Emergency Department on 03/17/2019 for the symptoms described in the history of present illness. He was evaluated in the context of the global COVID-19 pandemic, which necessitated consideration that the patient might be at risk for infection with the SARS-CoV-2 virus that causes COVID-19. Institutional protocols and algorithms that pertain to the evaluation of patients at risk for COVID-19 are in a state of rapid change based on information released by regulatory bodies including the CDC and federal and state organizations. These policies and algorithms were followed during the patient's care in the ED.   Medications Ordered in ED Medications  doxycycline  (VIBRA-TABS) tablet 100 mg (has no administration in time range)     Initial Impression / Assessment and Plan / ED Course  I have reviewed the triage vital signs and the nursing notes.  Pertinent labs & imaging results that were available during my care of the patient were reviewed by me and considered in my medical decision making (see chart for details).        Terry Knox is a 78 y.o. male with a past medical history significant for hypertension, hyperlipidemia, stroke, and chronic tobacco abuse who presents with cough and right-sided chest pain.  Patient reports that for the last 4 days he has had a productive cough.  He denies fevers or chills and denies any sick contacts but he has had this persistent cough with a white sputum.  He describes that he began having pain when he is coughing in his right lateral chest but denies any anterior chest pain.  He reports the pain is not constant but only when he is coughing.  It is not exertional or pleuritic.  He denies any hemoptysis.  He denies any nausea, vomiting, constipation, diarrhea, urinary symptoms or other complaints.  He is feeling well aside from the 10 out of 10 chest pain when he is coughing.  On exam, lungs are difficult to auscultate but more overall slightly rhonchus.  Chest and back are nontender.  Lateral chest was nontender to my palpation.  Abdomen was nontender.  Symmetric radial pulses.  Patient resting comfortably.  Oxygen saturation 100% on room air.  Clinically suspect patient may have musculoskeletal pain in the setting of coughing fits however with the context of the global coronavirus pandemic, will check for coronavirus, x-ray, and labs.  Low suspicion for a cardiac cause of his chest pain given the context of coughing and the extreme lateral location of the pain.  We will get troponin and EKG.  With lack of pleuritic pain, shortness of breath, exertional pain, or vital sign abnormalities, low suspicion for pulmonary  ballismus at this time.  Anticipate reassessment after work-up.  Patient's x-ray shows evidence of possible pneumonia.  Given his productive cough, will treat with antibiotics.  Troponin negative x2.  CBC and CMP overall reassuring with no mild anemia.  No leukocytosis.  Patient vital signs were reassuring  and patient had no hypoxia in the emergency department.  Patient's oxygen saturation was 96 to 98% on room air.  He was not having any shortness of breath.  Cover test was negative.  Given his reassuring work-up, we feel he is safe for discharge home.  Patient will be given a prescription for outpatient antibiotics with doxycycline and will follow-up with PCP.  He understood extreme strict return precautions and had no other questions or concerns.  Patient discharged in good condition.  Final Clinical Impressions(s) / ED Diagnoses   Final diagnoses:  Cough  Community acquired pneumonia of right upper lobe of lung [facility])    ED Discharge Orders         Ordered    doxycycline (VIBRAMYCIN) 100 MG capsule  2 times daily     03/17/19 1306         Clinical Impression: 1. Cough   2. Community acquired pneumonia of right upper lobe of lung ([place])     Disposition: Discharge  Condition: Good  I have discussed the results, Dx and Tx plan with the pt(& family if present). He/she/they expressed understanding and agree(s) with the plan. Discharge instructions discussed at great length. Strict return precautions discussed and pt &/or family have verbalized understanding of the instructions. No further questions at time of discharge.    New Prescriptions   DOXYCYCLINE (VIBRAMYCIN) 100 MG CAPSULE    Take 1 capsule (100 mg total) by mouth 2 (two) times daily for 7 days.    Follow Up: Dixie Dials, MD [place] [address] [phone]     [place] [facility] [address] [id number] [place]       , Gwenyth Allegra, MD 03/17/19 1310

## 2019-03-17 NOTE — ED Notes (Signed)
Give pt [place] sandwich and apple juice

## 2019-03-22 LAB — CULTURE, BLOOD (ROUTINE X 2)
Culture: NO GROWTH
Culture: NO GROWTH
Special Requests: ADEQUATE

## 2019-03-23 DIAGNOSIS — J208 Acute bronchitis due to other specified organisms: Secondary | ICD-10-CM | POA: Diagnosis not present

## 2019-03-23 DIAGNOSIS — E7849 Other hyperlipidemia: Secondary | ICD-10-CM | POA: Diagnosis not present

## 2019-03-23 DIAGNOSIS — I10 Essential (primary) hypertension: Secondary | ICD-10-CM | POA: Diagnosis not present

## 2019-03-23 DIAGNOSIS — E440 Moderate protein-calorie malnutrition: Secondary | ICD-10-CM | POA: Diagnosis not present

## 2019-04-19 DIAGNOSIS — H35342 Macular cyst, hole, or pseudohole, left eye: Secondary | ICD-10-CM | POA: Diagnosis not present

## 2019-04-27 DIAGNOSIS — H35342 Macular cyst, hole, or pseudohole, left eye: Secondary | ICD-10-CM | POA: Diagnosis not present

## 2019-04-27 DIAGNOSIS — Z09 Encounter for follow-up examination after completed treatment for conditions other than malignant neoplasm: Secondary | ICD-10-CM | POA: Diagnosis not present

## 2019-05-11 DIAGNOSIS — I2510 Atherosclerotic heart disease of native coronary artery without angina pectoris: Secondary | ICD-10-CM | POA: Diagnosis not present

## 2019-05-11 DIAGNOSIS — E7849 Other hyperlipidemia: Secondary | ICD-10-CM | POA: Diagnosis not present

## 2019-05-11 DIAGNOSIS — E440 Moderate protein-calorie malnutrition: Secondary | ICD-10-CM | POA: Diagnosis not present

## 2019-05-11 DIAGNOSIS — I10 Essential (primary) hypertension: Secondary | ICD-10-CM | POA: Diagnosis not present

## 2019-05-11 DIAGNOSIS — H539 Unspecified visual disturbance: Secondary | ICD-10-CM | POA: Diagnosis not present

## 2019-06-06 DIAGNOSIS — I425 Other restrictive cardiomyopathy: Secondary | ICD-10-CM | POA: Diagnosis not present

## 2019-06-06 DIAGNOSIS — I2510 Atherosclerotic heart disease of native coronary artery without angina pectoris: Secondary | ICD-10-CM | POA: Diagnosis not present

## 2019-06-08 DIAGNOSIS — Z09 Encounter for follow-up examination after completed treatment for conditions other than malignant neoplasm: Secondary | ICD-10-CM | POA: Diagnosis not present

## 2019-06-08 DIAGNOSIS — H35342 Macular cyst, hole, or pseudohole, left eye: Secondary | ICD-10-CM | POA: Diagnosis not present

## 2019-06-14 DIAGNOSIS — H52202 Unspecified astigmatism, left eye: Secondary | ICD-10-CM | POA: Diagnosis not present

## 2019-06-14 DIAGNOSIS — H524 Presbyopia: Secondary | ICD-10-CM | POA: Diagnosis not present

## 2019-06-14 DIAGNOSIS — H5212 Myopia, left eye: Secondary | ICD-10-CM | POA: Diagnosis not present

## 2019-07-10 DIAGNOSIS — I10 Essential (primary) hypertension: Secondary | ICD-10-CM | POA: Diagnosis not present

## 2019-07-10 DIAGNOSIS — I2510 Atherosclerotic heart disease of native coronary artery without angina pectoris: Secondary | ICD-10-CM | POA: Diagnosis not present

## 2019-07-10 DIAGNOSIS — E7849 Other hyperlipidemia: Secondary | ICD-10-CM | POA: Diagnosis not present

## 2019-07-10 DIAGNOSIS — E440 Moderate protein-calorie malnutrition: Secondary | ICD-10-CM | POA: Diagnosis not present

## 2019-09-18 DIAGNOSIS — E7849 Other hyperlipidemia: Secondary | ICD-10-CM | POA: Diagnosis not present

## 2019-09-18 DIAGNOSIS — J449 Chronic obstructive pulmonary disease, unspecified: Secondary | ICD-10-CM | POA: Diagnosis not present

## 2019-09-18 DIAGNOSIS — E440 Moderate protein-calorie malnutrition: Secondary | ICD-10-CM | POA: Diagnosis not present

## 2019-09-18 DIAGNOSIS — I10 Essential (primary) hypertension: Secondary | ICD-10-CM | POA: Diagnosis not present

## 2019-09-19 DIAGNOSIS — H401133 Primary open-angle glaucoma, bilateral, severe stage: Secondary | ICD-10-CM | POA: Diagnosis not present

## 2019-10-05 DIAGNOSIS — H35342 Macular cyst, hole, or pseudohole, left eye: Secondary | ICD-10-CM | POA: Diagnosis not present

## 2019-10-05 DIAGNOSIS — H5440 Blindness, one eye, unspecified eye: Secondary | ICD-10-CM | POA: Diagnosis not present

## 2019-10-05 DIAGNOSIS — H353124 Nonexudative age-related macular degeneration, left eye, advanced atrophic with subfoveal involvement: Secondary | ICD-10-CM | POA: Diagnosis not present

## 2019-10-05 DIAGNOSIS — H401123 Primary open-angle glaucoma, left eye, severe stage: Secondary | ICD-10-CM | POA: Diagnosis not present

## 2019-10-25 ENCOUNTER — Other Ambulatory Visit: Payer: Self-pay

## 2019-10-25 ENCOUNTER — Encounter: Payer: Self-pay | Admitting: Occupational Therapy

## 2019-10-25 ENCOUNTER — Ambulatory Visit: Payer: Medicare HMO | Attending: Ophthalmology | Admitting: Occupational Therapy

## 2019-10-25 DIAGNOSIS — H53412 Scotoma involving central area, left eye: Secondary | ICD-10-CM | POA: Insufficient documentation

## 2019-10-25 NOTE — Therapy (Signed)
Terry [address] [place], [place], [address] Phone: [phone]   Fax:  [phone]  Occupational Therapy Treatment  Patient Details  Name: Terry Knox MRN: [id number] Date of Birth: 1940/10/10 Referring Provider (OT): Dr. Zadie Rhine   Encounter Date: 10/25/2019  OT End of Session - 10/25/19 1422    Visit Number  1    Number of Visits  1    OT Start Time  1020    OT Stop Time  1105    OT Time Calculation (min)  45 min    Activity Tolerance  Patient tolerated treatment well    Behavior During Therapy  [facility] for tasks assessed/performed       Past Medical History:  Diagnosis Date  . Hyperlipidemia   . Hypertension   . Stroke [facility])     Past Surgical History:  Procedure Laterality Date  . CARDIAC CATHETERIZATION N/A 06/07/2015   Procedure: Left Heart Cath and Coronary Angiography;  Surgeon: Dixie Dials, MD;  Location: [place] [facility];  Service: Cardiovascular;  Laterality: N/A;  . ENUCLEATION Right   . RUPTURED GLOBE EXPLORATION AND REPAIR Left     There were no vitals filed for this visit.  Subjective Assessment - 10/25/19 1420    Subjective   Pt reports he wants to see better    Patient Stated Goals  to see better    Currently in Pain?  No/denies         [facility] OT Assessment - 10/25/19 0001      Assessment   Medical Diagnosis  macular degeneration    Referring Provider (OT)  Dr. Zadie Rhine    Onset Date/Surgical Date  10/04/19      Precautions   Precautions  Other (comment)    Precaution Comments  visual deficits      Balance Screen   Has the patient fallen in the past 6 months  No    Has the patient had a decrease in activity level because of a fear of falling?   No    Is the patient reluctant to leave their home because of a fear of falling?   No      Home  Environment   Family/patient expects to be discharged to:  Private residence    Lives With  Daughter      Prior Function   Level of  Independence  Independent      ADL   ADL comments  Modified indpendent with all basic ADLs      IADL   Meal Prep  Able to complete simple warm meal prep      Vision - History   Visual History  Macular degeneration    Patient Visual Report  Central vision impairment      Vision Assessment   Vision Assessment  Vision tested    Per MD/OD Report  OD NLP, OS 20/400    Reading Acuity  (0.9)    Patient has diffculty with activities due to visual impairment  Adjusting stove/washing machine dials;Writing checks;Reading bills , Difficulty reading     [place] Exam   [place]                      OT Treatment/ Education - 10/25/19 1421    Education Details  use of 3x stand and handheld magnifiers and where to purchase, (pt demonstrates ability to read continuous text)eccentric viewing  Person(s) Educated  Patient;Other (comment)   grand dtr end of session   Methods  Explanation;Demonstration;Verbal cues;Handout    Comprehension  Verbalized understanding;Returned demonstration                 Plan - 10/25/19 1423    Clinical Impression Statement  Pt is a 79 y.o male with diagnosis of macular degeneration which impedes his ability to read. Pt presents to occupational therapy with a desire for AE to maximize pt's independence with daily activities and ability to read. Pt was seen for evaluation and treatment on day of eval. Pt demonstrates ability to read continuous test using 3x stand and handheld magnifiers following instruction. Pt's grand dtr was provided with info for purchase.    OT Occupational Profile and History  Problem Focused Assessment - Including review of records relating to presenting problem    Occupational performance deficits (Please refer to evaluation for details):  ADL's;IADL's;Leisure    Body Structure / Function / Physical Skills  ADL;Vision    Rehab Potential  Good    Clinical Decision Making  Limited treatment  options, no task modification necessary    Comorbidities Affecting Occupational Performance:  May have comorbidities impacting occupational performance    Modification or Assistance to Complete Evaluation   No modification of tasks or assist necessary to complete eval    OT Frequency  One time visit    OT Duration  8 weeks    OT Treatment/Interventions  Self-care/ADL training;Visual/perceptual remediation/compensation;Patient/family education;DME and/or AE instruction    Plan  No further OT visits needed at this time, pt and grand dtr.verbalize understanding of education.    Consulted and Agree with Plan of Care  Patient;Family member/caregiver       Patient will benefit from skilled therapeutic intervention in order to improve the following deficits and impairments:   Body Structure / Function / Physical Skills: ADL, Vision       Visit Diagnosis: [place] involving central area, left eye - Plan: Ot plan of care cert/re-cert    Problem List Patient Active Problem List   Diagnosis Date Noted  . Chest pain at rest 06/05/2015  . TIA (transient ischemic attack) 03/16/2015    Celestine Bougie 10/25/2019, 2:34 PM Theone Murdoch, OTR/L [phone] Phone: [phone] 2:34 PM 10/25/19 [place] [address] [place], [place], [address] Phone: [phone]   Fax:  [phone]  Name: Terry Knox MRN: [id number] Date of Birth: 02-May-1941

## 2019-11-21 DIAGNOSIS — H524 Presbyopia: Secondary | ICD-10-CM | POA: Diagnosis not present

## 2019-12-18 DIAGNOSIS — I10 Essential (primary) hypertension: Secondary | ICD-10-CM | POA: Diagnosis not present

## 2019-12-18 DIAGNOSIS — J449 Chronic obstructive pulmonary disease, unspecified: Secondary | ICD-10-CM | POA: Diagnosis not present

## 2019-12-18 DIAGNOSIS — E7849 Other hyperlipidemia: Secondary | ICD-10-CM | POA: Diagnosis not present

## 2019-12-18 DIAGNOSIS — E440 Moderate protein-calorie malnutrition: Secondary | ICD-10-CM | POA: Diagnosis not present

## 2020-01-26 ENCOUNTER — Encounter: Payer: Self-pay | Admitting: Psychology

## 2020-01-31 DIAGNOSIS — E440 Moderate protein-calorie malnutrition: Secondary | ICD-10-CM | POA: Diagnosis not present

## 2020-01-31 DIAGNOSIS — J449 Chronic obstructive pulmonary disease, unspecified: Secondary | ICD-10-CM | POA: Diagnosis not present

## 2020-01-31 DIAGNOSIS — E7849 Other hyperlipidemia: Secondary | ICD-10-CM | POA: Diagnosis not present

## 2020-01-31 DIAGNOSIS — I10 Essential (primary) hypertension: Secondary | ICD-10-CM | POA: Diagnosis not present

## 2020-02-19 DIAGNOSIS — D649 Anemia, unspecified: Secondary | ICD-10-CM | POA: Diagnosis not present

## 2020-02-19 DIAGNOSIS — Z961 Presence of intraocular lens: Secondary | ICD-10-CM | POA: Diagnosis not present

## 2020-02-19 DIAGNOSIS — H52202 Unspecified astigmatism, left eye: Secondary | ICD-10-CM | POA: Diagnosis not present

## 2020-02-19 DIAGNOSIS — Z970 Presence of artificial eye: Secondary | ICD-10-CM | POA: Diagnosis not present

## 2020-02-19 DIAGNOSIS — H5212 Myopia, left eye: Secondary | ICD-10-CM | POA: Diagnosis not present

## 2020-02-19 DIAGNOSIS — N429 Disorder of prostate, unspecified: Secondary | ICD-10-CM | POA: Diagnosis not present

## 2020-02-19 DIAGNOSIS — H524 Presbyopia: Secondary | ICD-10-CM | POA: Diagnosis not present

## 2020-02-19 DIAGNOSIS — E876 Hypokalemia: Secondary | ICD-10-CM | POA: Diagnosis not present

## 2020-02-19 DIAGNOSIS — H401123 Primary open-angle glaucoma, left eye, severe stage: Secondary | ICD-10-CM | POA: Diagnosis not present

## 2020-02-19 DIAGNOSIS — E7849 Other hyperlipidemia: Secondary | ICD-10-CM | POA: Diagnosis not present

## 2020-02-19 DIAGNOSIS — Z79899 Other long term (current) drug therapy: Secondary | ICD-10-CM | POA: Diagnosis not present

## 2020-05-06 DIAGNOSIS — I10 Essential (primary) hypertension: Secondary | ICD-10-CM | POA: Diagnosis not present

## 2020-05-06 DIAGNOSIS — J449 Chronic obstructive pulmonary disease, unspecified: Secondary | ICD-10-CM | POA: Diagnosis not present

## 2020-05-06 DIAGNOSIS — E7849 Other hyperlipidemia: Secondary | ICD-10-CM | POA: Diagnosis not present

## 2020-05-06 DIAGNOSIS — E440 Moderate protein-calorie malnutrition: Secondary | ICD-10-CM | POA: Diagnosis not present

## 2020-05-27 ENCOUNTER — Encounter: Payer: Self-pay | Admitting: Psychology

## 2020-05-27 ENCOUNTER — Encounter: Payer: Medicare HMO | Attending: Psychology | Admitting: Psychology

## 2020-05-27 ENCOUNTER — Other Ambulatory Visit: Payer: Self-pay

## 2020-05-27 DIAGNOSIS — F4321 Adjustment disorder with depressed mood: Secondary | ICD-10-CM | POA: Diagnosis not present

## 2020-05-27 DIAGNOSIS — G459 Transient cerebral ischemic attack, unspecified: Secondary | ICD-10-CM | POA: Insufficient documentation

## 2020-05-27 DIAGNOSIS — H353124 Nonexudative age-related macular degeneration, left eye, advanced atrophic with subfoveal involvement: Secondary | ICD-10-CM | POA: Diagnosis not present

## 2020-05-27 NOTE — Progress Notes (Signed)
Neuropsychological Consultation   Patient:   Terry Knox   DOB:   1941-01-17  MR Number:  [id number]  Location:  [facility] [place], [place] [id number] [address] [place] [address] Dept: [phone]           Date of Service:   05/27/2020  Start Time:   1 PM End Time:   3 PM  Today's visit was an in person visit that was conducted in my outpatient clinic office.  The first hour was spent in face-to-face clinical interview with the patient and the second hour consisted of medical records review and report writing.  Provider/Observer:  Ilean Skill, Psy.D.       Clinical Neuropsychologist       Billing Code/Service: 96116/96121  Chief Complaint:    Terry Knox is a 79 year old male referred by Deloria Lair, MD to facilitate adjustment and coping issues due to progressive age-related macular degeneration and vision loss.  The patient has a history of TIA prior in the setting of mild chronic small vessel ischemic disease and chronic right frontal encephalomalacia.  The patient has had difficulty adjusting to progressive vision losses and significant impact on ADLs and other activities that require vision.  Reason for Service:  Terry Knox is a 79 year old male referred by Deloria Lair, MD to facilitate adjustment and coping issues due to progressive age-related macular degeneration and vision loss.  The patient has a history of TIA prior in the setting of mild chronic small vessel ischemic disease and chronic right frontal encephalomalacia.  The patient has had difficulty adjusting to progressive vision losses and significant impact on ADLs and other activities that require vision.  The patient has a past medical history of likely TIA in 2016.  The patient is also been diagnosed with macular degeneration as well as primary open angle glaucoma, hypertension and high  cholesterol/hyperlipidemia.  The patient reports that he is having significant difficulties particularly with his left eye and has had surgery performed by Dr. Zadie Rhine for a macular cyst/hole.  The patient reports that he is having difficulty coping with his loss of vision and has been trying to figure out how to improve his eyesight.  The patient reports that even with prescription glasses he is not able to see very well and has bought a magnifying glass to help read printed material.  The patient has had some OT rehabilitation due to his vision with Theone Murdoch, OT.  The patient describes being very worried and having it "rough" with a loss of vision.  He reports that he is unable to effectively or enjoyable he watch TV and nothing that he looks at is very clear.  The patient reports that this is producing some difficulties managing day-to-day activities such as getting food or with transportation and he has to rely on others to take him places.  Behavioral Observation: Terry Knox  presents as a 79 y.o.-year-old Right African American Male who appeared his stated age. his dress was Appropriate and he was Well Groomed and his manners were Appropriate to the situation.  his participation was indicative of Appropriate and Redirectable behaviors.  There were any physical disabilities noted.  he displayed an appropriate level of cooperation and motivation.     Interactions:    Active Appropriate and Redirectable  Attention:   within normal limits and attention span and concentration were age appropriate  Memory:   within  normal limits; recent and remote memory intact  Visuo-spatial:  abnormal  Speech (Volume):  low  Speech:   normal; slurred  Thought Process:  Coherent and Relevant  Though Content:  WNL; not suicidal and not homicidal  Orientation:   person, place and  time/date  Judgment:   Fair  Planning:   Poor  Affect:    Blunted  Mood:    Dysphoric  Insight:   Fair  Intelligence:   normal  Marital Status/Living: The patient was born in [place] and has lived there his entire life.  Current Employment: The patient is not currently working.  Substance Use:  There is a documented history of tobacco abuse confirmed by the patient.    Education:   HS Graduate  Medical History:   Past Medical History:  Diagnosis Date  . Hyperlipidemia   . Hypertension   . Stroke [facility])     Psychiatric History:  No prior psychiatric history  Family Med/Psych History:  Family History  Problem Relation Age of Onset  . Hypertension Mother     Impression/DX:  Terry Knox is a 79 year old male referred by Deloria Lair, MD to facilitate adjustment and coping issues due to progressive age-related macular degeneration and vision loss.  The patient has a history of TIA prior in the setting of mild chronic small vessel ischemic disease and chronic right frontal encephalomalacia.  The patient has had difficulty adjusting to progressive vision losses and significant impact on ADLs and other activities that require vision.  The patient has a past medical history of likely TIA in 2016.  The patient is also been diagnosed with macular degeneration as well as primary open angle glaucoma, hypertension and high cholesterol/hyperlipidemia.  The patient reports that he is having significant difficulties particularly with his left eye and has had surgery performed by Dr. Zadie Rhine for a macular cyst/hole.  The patient reports that he is having difficulty coping with his loss of vision and has been trying to figure out how to improve his eyesight.  The patient reports that even with prescription glasses he is not able to see very well and has bought a magnifying glass to help read printed material.  The patient has had some OT rehabilitation due to his vision  with Theone Murdoch, OT.  The patient describes being very worried and having it "rough" with a loss of vision.  He reports that he is unable to effectively or enjoyable he watch TV and nothing that he looks at is very clear.  The patient reports that this is producing some difficulties managing day-to-day activities such as getting food or with transportation and he has to rely on others to take him places.    Disposition/Plan:  We have set the patient up for therapeutic interventions primarily around dealing with his medical/vision changes that are having a significant impact on his ADLs and essentially any activities involving vision.  The patient reports that he is having a difficult time coping with his loss of vision.  Diagnosis:    TIA (transient ischemic attack)  Nonexudative age-related macular degeneration, left eye, advanced atrophic with subfoveal involvement  Adjustment disorder with depressed mood         Electronically Signed   _______________________ Ilean Skill, Psy.D.

## 2020-05-31 DIAGNOSIS — L02214 Cutaneous abscess of groin: Secondary | ICD-10-CM | POA: Diagnosis not present

## 2020-06-26 DIAGNOSIS — H5440 Blindness, one eye, unspecified eye: Secondary | ICD-10-CM | POA: Insufficient documentation

## 2020-06-26 DIAGNOSIS — Z961 Presence of intraocular lens: Secondary | ICD-10-CM | POA: Insufficient documentation

## 2020-06-26 DIAGNOSIS — H35342 Macular cyst, hole, or pseudohole, left eye: Secondary | ICD-10-CM | POA: Insufficient documentation

## 2020-06-26 DIAGNOSIS — H401123 Primary open-angle glaucoma, left eye, severe stage: Secondary | ICD-10-CM | POA: Insufficient documentation

## 2020-06-26 DIAGNOSIS — H353124 Nonexudative age-related macular degeneration, left eye, advanced atrophic with subfoveal involvement: Secondary | ICD-10-CM | POA: Insufficient documentation

## 2020-07-04 ENCOUNTER — Encounter (INDEPENDENT_AMBULATORY_CARE_PROVIDER_SITE_OTHER): Payer: Medicare HMO | Admitting: Ophthalmology

## 2020-07-04 DIAGNOSIS — H353124 Nonexudative age-related macular degeneration, left eye, advanced atrophic with subfoveal involvement: Secondary | ICD-10-CM

## 2020-07-04 DIAGNOSIS — E440 Moderate protein-calorie malnutrition: Secondary | ICD-10-CM | POA: Diagnosis not present

## 2020-07-04 DIAGNOSIS — E7849 Other hyperlipidemia: Secondary | ICD-10-CM | POA: Diagnosis not present

## 2020-07-04 DIAGNOSIS — H35342 Macular cyst, hole, or pseudohole, left eye: Secondary | ICD-10-CM

## 2020-07-04 DIAGNOSIS — H5440 Blindness, one eye, unspecified eye: Secondary | ICD-10-CM

## 2020-07-04 DIAGNOSIS — R072 Precordial pain: Secondary | ICD-10-CM | POA: Diagnosis not present

## 2020-07-04 DIAGNOSIS — I10 Essential (primary) hypertension: Secondary | ICD-10-CM | POA: Diagnosis not present

## 2020-07-04 DIAGNOSIS — H401123 Primary open-angle glaucoma, left eye, severe stage: Secondary | ICD-10-CM | POA: Diagnosis not present

## 2020-07-04 DIAGNOSIS — Z961 Presence of intraocular lens: Secondary | ICD-10-CM

## 2020-07-18 ENCOUNTER — Encounter: Payer: Medicare HMO | Attending: Psychology | Admitting: Psychology

## 2020-07-18 ENCOUNTER — Other Ambulatory Visit: Payer: Self-pay

## 2020-07-18 DIAGNOSIS — G459 Transient cerebral ischemic attack, unspecified: Secondary | ICD-10-CM | POA: Diagnosis not present

## 2020-07-18 DIAGNOSIS — F4321 Adjustment disorder with depressed mood: Secondary | ICD-10-CM | POA: Insufficient documentation

## 2020-07-18 DIAGNOSIS — H353124 Nonexudative age-related macular degeneration, left eye, advanced atrophic with subfoveal involvement: Secondary | ICD-10-CM | POA: Insufficient documentation

## 2020-07-24 ENCOUNTER — Encounter: Payer: Self-pay | Admitting: Psychology

## 2020-07-24 NOTE — Progress Notes (Signed)
Neuropsychological Consultation   Patient:   Terry Knox   DOB:   Oct 04, 1940  MR Number:  [id number]  Location:  [facility] [place], [place] [id number] [address] [place] [address] Dept: [phone]           Date of Service:   07/18/2020  Start Time:   3 PM End Time:   4 PM  Today's visit was an in person visit that was conducted in my outpatient clinic office.  The first hour was spent in face-to-face clinical interview with the patient and the second hour consisted of medical records review and report writing.  Provider/Observer:  Ilean Skill, Psy.D.       Clinical Neuropsychologist       Billing Code/Service: 96158/96159  Chief Complaint:    Terry Knox is a 79 year old male referred by Deloria Lair, MD to facilitate adjustment and coping issues due to progressive age-related macular degeneration and vision loss.  The patient has a history of TIA prior in the setting of mild chronic small vessel ischemic disease and chronic right frontal encephalomalacia.  The patient has had difficulty adjusting to progressive vision losses and significant impact on ADLs and other activities that require vision.  Reason for Service:  Terry Knox is a 79 year old male referred by Deloria Lair, MD to facilitate adjustment and coping issues due to progressive age-related macular degeneration and vision loss.  The patient has a history of TIA prior in the setting of mild chronic small vessel ischemic disease and chronic right frontal encephalomalacia.  The patient has had difficulty adjusting to progressive vision losses and significant impact on ADLs and other activities that require vision.  The patient has a past medical history of likely TIA in 2016.  The patient is also been diagnosed with macular degeneration as well as primary open angle glaucoma, hypertension and high  cholesterol/hyperlipidemia.  The patient reports that he is having significant difficulties particularly with his left eye and has had surgery performed by Dr. Zadie Rhine for a macular cyst/hole.  The patient reports that he is having difficulty coping with his loss of vision and has been trying to figure out how to improve his eyesight.  The patient reports that even with prescription glasses he is not able to see very well and has bought a magnifying glass to help read printed material.  The patient has had some OT rehabilitation due to his vision with Theone Murdoch, OT.  The patient describes being very worried and having it "rough" with a loss of vision.  He reports that he is unable to effectively or enjoyable he watch TV and nothing that he looks at is very clear.  The patient reports that this is producing some difficulties managing day-to-day activities such as getting food or with transportation and he has to rely on others to take him places.  Behavioral Observation: Terry Knox  presents as a 79 y.o.-year-old Right African American Male who appeared his stated age. his dress was Appropriate and he was Well Groomed and his manners were Appropriate to the situation.  his participation was indicative of Appropriate and Redirectable behaviors.  There were any physical disabilities noted.  he displayed an appropriate level of cooperation and motivation.     Interactions:    Active Appropriate and Redirectable  Attention:   within normal limits and attention span and concentration were age appropriate  Memory:   within  normal limits; recent and remote memory intact  Visuo-spatial:  abnormal  Speech (Volume):  low  Speech:   normal; slurred  Thought Process:  Coherent and Relevant  Though Content:  WNL; not suicidal and not homicidal  Orientation:   person, place and  time/date  Judgment:   Fair  Planning:   Poor  Affect:    Blunted  Mood:    Dysphoric  Insight:   Fair  Intelligence:   normal  Marital Status/Living: The patient was born in [place] and has lived there his entire life.  Current Employment: The patient is not currently working.  Substance Use:  There is a documented history of tobacco abuse confirmed by the patient.    Education:   HS Graduate  Medical History:   Past Medical History:  Diagnosis Date  . Hyperlipidemia   . Hypertension   . Stroke [facility])     Psychiatric History:  No prior psychiatric history  Family Med/Psych History:  Family History  Problem Relation Age of Onset  . Hypertension Mother     Impression/DX:  Terry Knox is a 79 year old male referred by Deloria Lair, MD to facilitate adjustment and coping issues due to progressive age-related macular degeneration and vision loss.  The patient has a history of TIA prior in the setting of mild chronic small vessel ischemic disease and chronic right frontal encephalomalacia.  The patient has had difficulty adjusting to progressive vision losses and significant impact on ADLs and other activities that require vision.  The patient has a past medical history of likely TIA in 2016.  The patient is also been diagnosed with macular degeneration as well as primary open angle glaucoma, hypertension and high cholesterol/hyperlipidemia.  The patient reports that he is having significant difficulties particularly with his left eye and has had surgery performed by Dr. Zadie Rhine for a macular cyst/hole.  The patient reports that he is having difficulty coping with his loss of vision and has been trying to figure out how to improve his eyesight.  The patient reports that even with prescription glasses he is not able to see very well and has bought a magnifying glass to help read printed material.  The patient has had some OT rehabilitation due to his vision  with Theone Murdoch, OT.  The patient describes being very worried and having it "rough" with a loss of vision.  He reports that he is unable to effectively or enjoyable he watch TV and nothing that he looks at is very clear.  The patient reports that this is producing some difficulties managing day-to-day activities such as getting food or with transportation and he has to rely on others to take him places.  07/18/2020: Today's visit was an in person visit was conducted in my outpatient clinic office.  The patient was present for this visit along with myself.  The patient continues to struggle with adjustment around issues of residual effects of his TIA and coping with macular degeneration and loss of vision.    Disposition/Plan:  We have set the patient up for therapeutic interventions primarily around dealing with his medical/vision changes that are having a significant impact on his ADLs and essentially any activities involving vision.  The patient reports that he is having a difficult time coping with his loss of vision.  Diagnosis:    TIA (transient ischemic attack)  Adjustment disorder with depressed mood  Nonexudative age-related macular degeneration, left eye, advanced atrophic with subfoveal involvement  Electronically Signed   _______________________ Ilean Skill, Psy.D.

## 2020-07-25 ENCOUNTER — Emergency Department (HOSPITAL_COMMUNITY): Payer: Medicare HMO

## 2020-07-25 ENCOUNTER — Encounter (INDEPENDENT_AMBULATORY_CARE_PROVIDER_SITE_OTHER): Payer: Medicare HMO | Admitting: Ophthalmology

## 2020-07-25 ENCOUNTER — Inpatient Hospital Stay (HOSPITAL_COMMUNITY)
Admission: EM | Admit: 2020-07-25 | Discharge: 2020-07-30 | DRG: 065 | Disposition: A | Discharge status: Home / Self Care | Payer: Medicare HMO | Attending: Cardiovascular Disease | Admitting: Cardiovascular Disease

## 2020-07-25 ENCOUNTER — Encounter (HOSPITAL_COMMUNITY): Payer: Self-pay

## 2020-07-25 DIAGNOSIS — F4325 Adjustment disorder with mixed disturbance of emotions and conduct: Secondary | ICD-10-CM | POA: Diagnosis not present

## 2020-07-25 DIAGNOSIS — I10 Essential (primary) hypertension: Secondary | ICD-10-CM | POA: Diagnosis not present

## 2020-07-25 DIAGNOSIS — H353124 Nonexudative age-related macular degeneration, left eye, advanced atrophic with subfoveal involvement: Secondary | ICD-10-CM | POA: Diagnosis not present

## 2020-07-25 DIAGNOSIS — R29700 NIHSS score 0: Secondary | ICD-10-CM | POA: Diagnosis present

## 2020-07-25 DIAGNOSIS — Z681 Body mass index (BMI) 19 or less, adult: Secondary | ICD-10-CM

## 2020-07-25 DIAGNOSIS — F17210 Nicotine dependence, cigarettes, uncomplicated: Secondary | ICD-10-CM | POA: Diagnosis present

## 2020-07-25 DIAGNOSIS — Z79899 Other long term (current) drug therapy: Secondary | ICD-10-CM

## 2020-07-25 DIAGNOSIS — E440 Moderate protein-calorie malnutrition: Secondary | ICD-10-CM | POA: Diagnosis present

## 2020-07-25 DIAGNOSIS — J439 Emphysema, unspecified: Secondary | ICD-10-CM | POA: Diagnosis present

## 2020-07-25 DIAGNOSIS — Z7902 Long term (current) use of antithrombotics/antiplatelets: Secondary | ICD-10-CM | POA: Diagnosis not present

## 2020-07-25 DIAGNOSIS — S199XXA Unspecified injury of neck, initial encounter: Secondary | ICD-10-CM | POA: Diagnosis not present

## 2020-07-25 DIAGNOSIS — Z20822 Contact with and (suspected) exposure to covid-19: Secondary | ICD-10-CM | POA: Diagnosis present

## 2020-07-25 DIAGNOSIS — F17200 Nicotine dependence, unspecified, uncomplicated: Secondary | ICD-10-CM | POA: Diagnosis not present

## 2020-07-25 DIAGNOSIS — Z970 Presence of artificial eye: Secondary | ICD-10-CM

## 2020-07-25 DIAGNOSIS — R4182 Altered mental status, unspecified: Secondary | ICD-10-CM | POA: Diagnosis not present

## 2020-07-25 DIAGNOSIS — I471 Supraventricular tachycardia: Secondary | ICD-10-CM | POA: Diagnosis not present

## 2020-07-25 DIAGNOSIS — E7849 Other hyperlipidemia: Secondary | ICD-10-CM | POA: Diagnosis not present

## 2020-07-25 DIAGNOSIS — Z7982 Long term (current) use of aspirin: Secondary | ICD-10-CM | POA: Diagnosis not present

## 2020-07-25 DIAGNOSIS — Z8673 Personal history of transient ischemic attack (TIA), and cerebral infarction without residual deficits: Secondary | ICD-10-CM | POA: Diagnosis not present

## 2020-07-25 DIAGNOSIS — S0990XA Unspecified injury of head, initial encounter: Secondary | ICD-10-CM | POA: Diagnosis not present

## 2020-07-25 DIAGNOSIS — E785 Hyperlipidemia, unspecified: Secondary | ICD-10-CM | POA: Diagnosis present

## 2020-07-25 DIAGNOSIS — I639 Cerebral infarction, unspecified: Secondary | ICD-10-CM | POA: Diagnosis not present

## 2020-07-25 DIAGNOSIS — Z8249 Family history of ischemic heart disease and other diseases of the circulatory system: Secondary | ICD-10-CM | POA: Diagnosis not present

## 2020-07-25 DIAGNOSIS — I6782 Cerebral ischemia: Secondary | ICD-10-CM | POA: Diagnosis not present

## 2020-07-25 DIAGNOSIS — H401123 Primary open-angle glaucoma, left eye, severe stage: Secondary | ICD-10-CM | POA: Diagnosis not present

## 2020-07-25 DIAGNOSIS — I6381 Other cerebral infarction due to occlusion or stenosis of small artery: Secondary | ICD-10-CM | POA: Diagnosis not present

## 2020-07-25 DIAGNOSIS — H409 Unspecified glaucoma: Secondary | ICD-10-CM | POA: Diagnosis not present

## 2020-07-25 DIAGNOSIS — W2111XA Struck by baseball bat, initial encounter: Secondary | ICD-10-CM | POA: Diagnosis not present

## 2020-07-25 DIAGNOSIS — I340 Nonrheumatic mitral (valve) insufficiency: Secondary | ICD-10-CM | POA: Diagnosis not present

## 2020-07-25 DIAGNOSIS — G459 Transient cerebral ischemic attack, unspecified: Secondary | ICD-10-CM | POA: Diagnosis not present

## 2020-07-25 DIAGNOSIS — F4324 Adjustment disorder with disturbance of conduct: Secondary | ICD-10-CM | POA: Diagnosis not present

## 2020-07-25 DIAGNOSIS — I361 Nonrheumatic tricuspid (valve) insufficiency: Secondary | ICD-10-CM | POA: Diagnosis not present

## 2020-07-25 DIAGNOSIS — R456 Violent behavior: Secondary | ICD-10-CM | POA: Diagnosis not present

## 2020-07-25 DIAGNOSIS — R4689 Other symptoms and signs involving appearance and behavior: Secondary | ICD-10-CM

## 2020-07-25 LAB — CBC
HCT: 39.6 % (ref 39.0–52.0)
Hemoglobin: 13.2 g/dL (ref 13.0–17.0)
MCH: 32.9 pg (ref 26.0–34.0)
MCHC: 33.3 g/dL (ref 30.0–36.0)
MCV: 98.8 fL (ref 80.0–100.0)
Platelets: 265 10*3/uL (ref 150–400)
RBC: 4.01 MIL/uL — ABNORMAL LOW (ref 4.22–5.81)
RDW: 12.6 % (ref 11.5–15.5)
WBC: 5.6 10*3/uL (ref 4.0–10.5)
nRBC: 0 % (ref 0.0–0.2)

## 2020-07-25 LAB — COMPREHENSIVE METABOLIC PANEL
ALT: 17 U/L (ref 0–44)
AST: 17 U/L (ref 15–41)
Albumin: 4.2 g/dL (ref 3.5–5.0)
Alkaline Phosphatase: 47 U/L (ref 38–126)
Anion gap: 12 (ref 5–15)
BUN: 11 mg/dL (ref 8–23)
CO2: 24 mmol/L (ref 22–32)
Calcium: 9.7 mg/dL (ref 8.9–10.3)
Chloride: 102 mmol/L (ref 98–111)
Creatinine, Ser: 0.84 mg/dL (ref 0.61–1.24)
GFR, Estimated: >60 mL/min (ref >60)
Glucose, Bld: 112 mg/dL — ABNORMAL HIGH (ref 70–99)
Potassium: 3.6 mmol/L (ref 3.5–5.1)
Sodium: 138 mmol/L (ref 135–145)
Total Bilirubin: 0.6 mg/dL (ref 0.3–1.2)
Total Protein: 7.6 g/dL (ref 6.5–8.1)

## 2020-07-25 LAB — RAPID URINE DRUG SCREEN, HOSP PERFORMED
Amphetamines: NOT DETECTED
Barbiturates: NOT DETECTED
Benzodiazepines: NOT DETECTED
Cocaine: NOT DETECTED
Opiates: NOT DETECTED
Tetrahydrocannabinol: NOT DETECTED

## 2020-07-25 LAB — URINALYSIS, ROUTINE W REFLEX MICROSCOPIC
Bacteria, UA: NONE SEEN
Bilirubin Urine: NEGATIVE
Glucose, UA: NEGATIVE mg/dL
Ketones, ur: NEGATIVE mg/dL
Leukocytes,Ua: NEGATIVE
Nitrite: NEGATIVE
Protein, ur: NEGATIVE mg/dL
Specific Gravity, Urine: 1.015 (ref 1.005–1.030)
pH: 5 (ref 5.0–8.0)

## 2020-07-25 LAB — TSH: TSH: 1.607 u[IU]/mL (ref 0.350–4.500)

## 2020-07-25 LAB — ETHANOL: Alcohol, Ethyl (B): <10 mg/dL (ref <10)

## 2020-07-25 LAB — SALICYLATE LEVEL: Salicylate Lvl: <7 mg/dL — ABNORMAL LOW (ref 7.0–30.0)

## 2020-07-25 LAB — ACETAMINOPHEN LEVEL: Acetaminophen (Tylenol), Serum: 35 ug/mL — ABNORMAL HIGH (ref 10–30)

## 2020-07-25 MED ORDER — ASPIRIN EC 81 MG PO TBEC
81.0000 mg | DELAYED_RELEASE_TABLET | Freq: Every day | ORAL | Status: DC
  Administered 2020-07-26 – 2020-07-30 (×5): 81 mg via ORAL
  Filled 2020-07-25 (×5): qty 1

## 2020-07-25 MED ORDER — LISINOPRIL 5 MG PO TABS
5.0000 mg | ORAL_TABLET | Freq: Every day | ORAL | Status: DC
  Administered 2020-07-26 – 2020-07-30 (×5): 5 mg via ORAL
  Filled 2020-07-25 (×5): qty 1

## 2020-07-25 MED ORDER — AMLODIPINE BESYLATE 2.5 MG PO TABS
2.5000 mg | ORAL_TABLET | Freq: Every day | ORAL | Status: DC
  Administered 2020-07-26 – 2020-07-30 (×5): 2.5 mg via ORAL
  Filled 2020-07-25 (×5): qty 1

## 2020-07-25 MED ORDER — SIMVASTATIN 20 MG PO TABS
20.0000 mg | ORAL_TABLET | Freq: Every day | ORAL | Status: DC
  Administered 2020-07-26: 20 mg via ORAL
  Filled 2020-07-25: qty 1

## 2020-07-25 MED ORDER — CLOPIDOGREL BISULFATE 75 MG PO TABS
75.0000 mg | ORAL_TABLET | Freq: Every day | ORAL | Status: DC
  Administered 2020-07-26 – 2020-07-30 (×5): 75 mg via ORAL
  Filled 2020-07-25 (×5): qty 1

## 2020-07-25 MED ORDER — POTASSIUM CHLORIDE CRYS ER 10 MEQ PO TBCR
10.0000 meq | EXTENDED_RELEASE_TABLET | Freq: Every day | ORAL | Status: DC
  Administered 2020-07-26 – 2020-07-30 (×5): 10 meq via ORAL
  Filled 2020-07-25 (×5): qty 1

## 2020-07-25 MED ORDER — TIMOLOL MALEATE 0.5 % OP SOLG
1.0000 [drp] | Freq: Two times a day (BID) | OPHTHALMIC | Status: DC
  Administered 2020-07-26 – 2020-07-30 (×9): 1 [drp] via OPHTHALMIC
  Filled 2020-07-25: qty 5
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
## 2020-07-26 ENCOUNTER — Emergency Department (HOSPITAL_COMMUNITY): Payer: Medicare HMO

## 2020-07-26 ENCOUNTER — Inpatient Hospital Stay (HOSPITAL_COMMUNITY): Payer: Medicare HMO

## 2020-07-26 DIAGNOSIS — Z681 Body mass index (BMI) 19 or less, adult: Secondary | ICD-10-CM | POA: Diagnosis not present

## 2020-07-26 DIAGNOSIS — H401123 Primary open-angle glaucoma, left eye, severe stage: Secondary | ICD-10-CM | POA: Diagnosis present

## 2020-07-26 DIAGNOSIS — Z7982 Long term (current) use of aspirin: Secondary | ICD-10-CM | POA: Diagnosis not present

## 2020-07-26 DIAGNOSIS — R4182 Altered mental status, unspecified: Secondary | ICD-10-CM | POA: Diagnosis present

## 2020-07-26 DIAGNOSIS — Z8249 Family history of ischemic heart disease and other diseases of the circulatory system: Secondary | ICD-10-CM | POA: Diagnosis not present

## 2020-07-26 DIAGNOSIS — Z79899 Other long term (current) drug therapy: Secondary | ICD-10-CM | POA: Diagnosis not present

## 2020-07-26 DIAGNOSIS — S0990XA Unspecified injury of head, initial encounter: Secondary | ICD-10-CM | POA: Diagnosis present

## 2020-07-26 DIAGNOSIS — I471 Supraventricular tachycardia: Secondary | ICD-10-CM | POA: Diagnosis not present

## 2020-07-26 DIAGNOSIS — H409 Unspecified glaucoma: Secondary | ICD-10-CM

## 2020-07-26 DIAGNOSIS — E785 Hyperlipidemia, unspecified: Secondary | ICD-10-CM | POA: Diagnosis present

## 2020-07-26 DIAGNOSIS — I10 Essential (primary) hypertension: Secondary | ICD-10-CM | POA: Diagnosis present

## 2020-07-26 DIAGNOSIS — I6381 Other cerebral infarction due to occlusion or stenosis of small artery: Secondary | ICD-10-CM | POA: Diagnosis present

## 2020-07-26 DIAGNOSIS — J439 Emphysema, unspecified: Secondary | ICD-10-CM | POA: Diagnosis present

## 2020-07-26 DIAGNOSIS — Z970 Presence of artificial eye: Secondary | ICD-10-CM | POA: Diagnosis not present

## 2020-07-26 DIAGNOSIS — W2111XA Struck by baseball bat, initial encounter: Secondary | ICD-10-CM | POA: Diagnosis not present

## 2020-07-26 DIAGNOSIS — F17200 Nicotine dependence, unspecified, uncomplicated: Secondary | ICD-10-CM | POA: Diagnosis not present

## 2020-07-26 DIAGNOSIS — I639 Cerebral infarction, unspecified: Secondary | ICD-10-CM | POA: Diagnosis not present

## 2020-07-26 DIAGNOSIS — H353124 Nonexudative age-related macular degeneration, left eye, advanced atrophic with subfoveal involvement: Secondary | ICD-10-CM | POA: Diagnosis present

## 2020-07-26 DIAGNOSIS — R29700 NIHSS score 0: Secondary | ICD-10-CM | POA: Diagnosis present

## 2020-07-26 DIAGNOSIS — Z7902 Long term (current) use of antithrombotics/antiplatelets: Secondary | ICD-10-CM | POA: Diagnosis not present

## 2020-07-26 DIAGNOSIS — Z20822 Contact with and (suspected) exposure to covid-19: Secondary | ICD-10-CM | POA: Diagnosis present

## 2020-07-26 DIAGNOSIS — Z8673 Personal history of transient ischemic attack (TIA), and cerebral infarction without residual deficits: Secondary | ICD-10-CM | POA: Diagnosis not present

## 2020-07-26 DIAGNOSIS — F4324 Adjustment disorder with disturbance of conduct: Secondary | ICD-10-CM | POA: Diagnosis present

## 2020-07-26 DIAGNOSIS — E440 Moderate protein-calorie malnutrition: Secondary | ICD-10-CM | POA: Diagnosis present

## 2020-07-26 DIAGNOSIS — F17210 Nicotine dependence, cigarettes, uncomplicated: Secondary | ICD-10-CM | POA: Diagnosis present

## 2020-07-26 LAB — HEMOGLOBIN A1C
Hgb A1c MFr Bld: 5.8 % — ABNORMAL HIGH (ref 4.8–5.6)
Mean Plasma Glucose: 119.76 mg/dL

## 2020-07-26 LAB — RESPIRATORY PANEL BY RT PCR (FLU A&B, COVID)
Influenza A by PCR: NEGATIVE
Influenza B by PCR: NEGATIVE
SARS Coronavirus 2 by RT PCR: NEGATIVE

## 2020-07-26 LAB — LIPID PANEL
Cholesterol: 111 mg/dL (ref 0–200)
HDL: 54 mg/dL (ref >40)
LDL Cholesterol: 43 mg/dL (ref 0–99)
Total CHOL/HDL Ratio: 2.1 RATIO
Triglycerides: 70 mg/dL (ref <150)
VLDL: 14 mg/dL (ref 0–40)

## 2020-07-26 LAB — CREATININE, SERUM
Creatinine, Ser: 0.88 mg/dL (ref 0.61–1.24)
GFR, Estimated: >60 mL/min (ref >60)

## 2020-07-26 LAB — CBC
HCT: 39.2 % (ref 39.0–52.0)
Hemoglobin: 12.8 g/dL — ABNORMAL LOW (ref 13.0–17.0)
MCH: 32.7 pg (ref 26.0–34.0)
MCHC: 32.7 g/dL (ref 30.0–36.0)
MCV: 100 fL (ref 80.0–100.0)
Platelets: 265 10*3/uL (ref 150–400)
RBC: 3.92 MIL/uL — ABNORMAL LOW (ref 4.22–5.81)
RDW: 12.8 % (ref 11.5–15.5)
WBC: 5.1 10*3/uL (ref 4.0–10.5)
nRBC: 0 % (ref 0.0–0.2)

## 2020-07-26 LAB — ECHOCARDIOGRAM COMPLETE
Area-P 1/2: 3.34 cm2
Height: 69 in
S' Lateral: 2.1 cm
Weight: 1952 oz

## 2020-07-26 LAB — ACETAMINOPHEN LEVEL: Acetaminophen (Tylenol), Serum: 19 ug/mL (ref 10–30)

## 2020-07-26 MED ORDER — STROKE: EARLY STAGES OF RECOVERY BOOK
Freq: Once | Status: AC
  Filled 2020-07-26: qty 1

## 2020-07-26 MED ORDER — SODIUM CHLORIDE 0.9 % IV SOLN: INTRAVENOUS | Status: DC

## 2020-07-26 MED ORDER — ATORVASTATIN CALCIUM 40 MG PO TABS
40.0000 mg | ORAL_TABLET | Freq: Every day | ORAL | Status: DC
  Administered 2020-07-27: 40 mg via ORAL
  Filled 2020-07-26: qty 1

## 2020-07-26 MED ORDER — QUETIAPINE FUMARATE 25 MG PO TABS: 12.5000 mg | ORAL_TABLET | Freq: Two times a day (BID) | ORAL | Status: DC | PRN

## 2020-07-26 MED ORDER — ATORVASTATIN CALCIUM 40 MG PO TABS: 40.0000 mg | ORAL_TABLET | Freq: Every day | ORAL | Status: DC

## 2020-07-26 MED ORDER — HEPARIN SODIUM (PORCINE) 5000 UNIT/ML IJ SOLN
5000.0000 [IU] | Freq: Three times a day (TID) | INTRAMUSCULAR | Status: DC
  Administered 2020-07-26 – 2020-07-30 (×13): 5000 [IU] via SUBCUTANEOUS
  Filled 2020-07-26 (×13): qty 1
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
## 2020-07-26 NOTE — ED Provider Notes (Signed)
TTS consultation is appreciated.  Patient will be held in the ED and reevaluated by psychiatry in the morning.   Delora Fuel, MD 76/15/18 [phone]

## 2020-07-26 NOTE — ED Notes (Signed)
Called dr Doylene Canard to pa Upstill--Cincere Deprey

## 2020-07-26 NOTE — ED Notes (Signed)
Called dr Doylene Canard to pa Humes--Britanee Vanblarcom
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
## 2020-07-26 NOTE — Progress Notes (Signed)
  Echocardiogram 2D Echocardiogram has been performed.  Terry Knox 07/26/2020, 12:40 PM
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
## 2020-07-26 NOTE — ED Notes (Signed)
Patient passed stroke swallow screen. MD messaged to request diet order.

## 2020-07-26 NOTE — Progress Notes (Signed)
Attempted to complete psych consult, patient is off the floor at this time. Currently undergoing stroke workup. Patient behaviors and level of aggression maybe 2/t recent CVA. It is normal for persons with hx of stroke to present with increased aggression, agitation and other emotional changes (frontal lobe). Patient to benefit from low dose seroquel 12.5mg  po BID prn for aggression and agitation. EKG obtained yesterday shows Qtc of 414.
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
## 2020-07-26 NOTE — Progress Notes (Signed)
Carotid duplex bilateral study completed.   Please see CV Proc for preliminary results.   Rayona Sardinha, RDMS  

## 2020-07-27 DIAGNOSIS — F4324 Adjustment disorder with disturbance of conduct: Secondary | ICD-10-CM

## 2020-07-27 DIAGNOSIS — I10 Essential (primary) hypertension: Secondary | ICD-10-CM

## 2020-07-27 DIAGNOSIS — I6381 Other cerebral infarction due to occlusion or stenosis of small artery: Principal | ICD-10-CM

## 2020-07-27 DIAGNOSIS — F17200 Nicotine dependence, unspecified, uncomplicated: Secondary | ICD-10-CM

## 2020-07-27 MED ORDER — QUETIAPINE FUMARATE 25 MG PO TABS
12.5000 mg | ORAL_TABLET | Freq: Every day | ORAL | Status: DC
  Administered 2020-07-27 – 2020-07-29 (×3): 12.5 mg via ORAL
  Filled 2020-07-27 (×3): qty 1

## 2020-07-27 MED ORDER — SIMVASTATIN 20 MG PO TABS
20.0000 mg | ORAL_TABLET | Freq: Every day | ORAL | Status: DC
  Administered 2020-07-28 – 2020-07-29 (×2): 20 mg via ORAL
  Filled 2020-07-27 (×2): qty 1

## 2020-07-27 NOTE — Progress Notes (Signed)
Physical Therapy Discharge Patient Details Name: Terry Knox MRN: [id number] DOB: December 16, 1940 Today's Date: 07/27/2020 Time: 6962-9528 PT Time Calculation (min) (ACUTE ONLY): 21 min  Patient discharged from PT services secondary to goals met and no further PT needs identified.  Please see latest therapy progress note for current level of functioning and progress toward goals.    Progress and discharge plan discussed with patient and/or caregiver: Patient/Caregiver agrees with plan   [name], DPT Acute Rehabilitation Services [phone]  GP     [name] 07/27/2020, 2:23 PM
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
## 2020-07-27 NOTE — Progress Notes (Addendum)
CSW received consult for housing assistance for patient. CSW spoke with patient at bedside. CSW provided patient with resources for housing. Patient wanted CSW to speak with his daughter Lervetris. Patient called Lervetris and CSW spoke with Lervetris in regards to housing assistance. Lervetris confirmed that patient needed some assistance with finding housing. CSW let patients daughter know that she can e-mail her some housing resources for patient to review. Patients daughter who is also a Education officer, museum who lives in [place] thanked Dale. CSW let patients daughter know after reviewing resources that if she had a questions to give her a call. CSW provided patients daughter with her cell phone number. Patients daughter thanked CSW.  CSW will continue to follow.

## 2020-07-27 NOTE — Progress Notes (Signed)
Ref: Dixie Dials, MD   Subjective:  Awake. VS stable.  Non-sustained slow VT on monitor, likely junctional tachycardia with aberrant conduction. Echocardiogram, carotid doppler are stable. Mild MR and TR. Mild AV stenosis.  Objective:  Vital Signs in the last 24 hours: Temp:  [98.1 F (36.7 C)-98.7 F (37.1 C)] 98.3 F (36.8 C) (11/13 0824) Pulse Rate:  [60-114] 114 (11/13 0824) Cardiac Rhythm: Normal sinus rhythm (11/13 0700) Resp:  [18-20] 18 (11/13 0824) BP: (106-138)/(52-73) 119/73 (11/13 1048) SpO2:  [96 %-98 %] 98 % (11/13 0824) Weight:  [55.9 kg] 55.9 kg (11/13 0515)  Physical Exam: BP Readings from Last 1 Encounters:  07/27/20 119/73     Wt Readings from Last 1 Encounters:  07/27/20 55.9 kg    Weight change:  Body mass index is 18.19 kg/m. HEENT: [place]/AT, Eyes-Brown, artificial right eye, Conjunctiva-Pink, Sclera-Non-icteric. Mild swelling of lips and occipital area persist. Neck: No JVD, No bruit, Trachea midline. Lungs:  Clear, Bilateral. Cardiac:  Regular rhythm, normal S1 and S2, no S3. II/VI systolic murmur. Abdomen:  Soft, non-tender. BS present. Extremities:  No edema present. No cyanosis. No clubbing. CNS: AxOx3, Cranial nerves grossly intact, moves all 4 extremities.  Skin: Warm and dry.   Intake/Output from previous day: 11/12 0701 - 11/13 0700 In: 720 [P.O.:720] Out: 1000 [Urine:1000]    Lab Results: BMET    Component Value Date/Time   NA 138 07/25/2020 1921   NA 137 03/17/2019 0948   NA 136 06/08/2015 0346   K 3.6 07/25/2020 1921   K 3.7 03/17/2019 0948   K 3.4 (L) 06/08/2015 0346   CL 102 07/25/2020 1921   CL 101 03/17/2019 0948   CL 101 06/08/2015 0346   CO2 24 07/25/2020 1921   CO2 28 03/17/2019 0948   CO2 29 06/08/2015 0346   GLUCOSE 112 (H) 07/25/2020 1921   GLUCOSE 102 (H) 03/17/2019 0948   GLUCOSE 108 (H) 06/08/2015 0346   BUN 11 07/25/2020 1921   BUN 11 03/17/2019 0948   BUN 13 06/08/2015 0346   CREATININE 0.88 07/26/2020  0816   CREATININE 0.84 07/25/2020 1921   CREATININE 0.66 03/17/2019 0948   CALCIUM 9.7 07/25/2020 1921   CALCIUM 9.0 03/17/2019 0948   CALCIUM 8.7 (L) 06/08/2015 0346   GFRNONAA >60 07/26/2020 0816   GFRNONAA >60 07/25/2020 1921   GFRNONAA >60 03/17/2019 0948   GFRNONAA >60 06/08/2015 0346   GFRNONAA >60 06/06/2015 0649   GFRAA >60 03/17/2019 0948   GFRAA >60 06/08/2015 0346   GFRAA >60 06/06/2015 0649   CBC    Component Value Date/Time   WBC 5.1 07/26/2020 0816   RBC 3.92 (L) 07/26/2020 0816   HGB 12.8 (L) 07/26/2020 0816   HCT 39.2 07/26/2020 0816   PLT 265 07/26/2020 0816   MCV 100.0 07/26/2020 0816   MCH 32.7 07/26/2020 0816   MCHC 32.7 07/26/2020 0816   RDW 12.8 07/26/2020 0816   LYMPHSABS 1.1 03/17/2019 0948   MONOABS 0.6 03/17/2019 0948   EOSABS 0.2 03/17/2019 0948   BASOSABS 0.0 03/17/2019 0948   HEPATIC Function Panel Recent Labs    07/25/20 1921  PROT 7.6   HEMOGLOBIN A1C No components found for: HGA1C,  MPG CARDIAC ENZYMES Lab Results  Component Value Date   TROPONINI <0.03 06/06/2015   TROPONINI <0.03 06/06/2015   TROPONINI <0.03 06/05/2015   BNP No results for input(s): PROBNP in the last 8760 hours. TSH Recent Labs    07/25/20 1921  TSH 1.607  CHOLESTEROL Recent Labs    07/26/20 0816  CHOL 111    Scheduled Meds: . amLODipine  2.5 mg Oral Daily  . aspirin EC  81 mg Oral Daily  . atorvastatin  40 mg Oral Daily  . clopidogrel  75 mg Oral Daily  . heparin  5,000 Units Subcutaneous Q8H  . lisinopril  5 mg Oral Daily  . potassium chloride  10 mEq Oral Daily  . timolol  1 drop Left Eye BID   Continuous Infusions: . sodium chloride     PRN Meds:.QUEtiapine  Assessment/Plan: Acute lacunar infarct Head trauma HTN Hyperlipidemia S/P stroke Moderate protein calorie malnutrition  Increase activity. Stable from medicine and cardiology for psych transfer.   LOS: 1 day   Time spent including chart review, lab review,  examination, discussion with patient/Nurse : 30 min   Dixie Dials  MD  07/27/2020, 11:14 AM

## 2020-07-27 NOTE — Progress Notes (Signed)
Spoke with Dr. Doylene Canard regarding psych recommendation. Dr. Doylene Canard agreed to discontinue IVC.  Idolina Primer, RN
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
## 2020-07-28 NOTE — Social Work (Addendum)
  CSW spoke with MD Doylene Canard in regards to patient having a safe discharge plan. MD reference the note from yesterday about the housing resources however MD was inquiring about a placement for patient.  CSW spoke with patient and he stated that he resides with daughter [name]. He stated that his plan was to get his own place however he wanted to return because his items are there. CSW inquired why he did not want to live with [name] anymore and he stated it was his grandson. Patient informed CSW that other CSW provided daughter with housing resources. Patient stated that he wanted a one bedroom apartment.  TOC team will continue to assist with discharge planning needs.Marland Kitchen
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
## 2020-07-28 NOTE — Progress Notes (Signed)
Ref: Dixie Dials, MD   Subjective:  Awake. No chest pain. Mild upper lip swelling and occipital swelling and pain persist.  Objective:  Vital Signs in the last 24 hours: Temp:  [98.5 F (36.9 C)-98.7 F (37.1 C)] 98.7 F (37.1 C) (11/14 0433) Pulse Rate:  [54-65] 62 (11/14 0433) Cardiac Rhythm: Normal sinus rhythm (11/14 0744) Resp:  [18] 18 (11/14 0433) BP: (115-131)/(67-88) 131/68 (11/14 0433) SpO2:  [94 %-99 %] 99 % (11/14 0433) Weight:  [56.9 kg] 56.9 kg (11/14 0433)  Physical Exam: BP Readings from Last 1 Encounters:  07/28/20 131/68     Wt Readings from Last 1 Encounters:  07/28/20 56.9 kg    Weight change: 1.531 kg Body mass index is 18.51 kg/m. HEENT: [place]/AT, Eyes-Brown, artificial right eye, Conjunctiva-Pink, Sclera-Non-icteric Neck: No JVD, No bruit, Trachea midline. Lungs:  Clear, Bilateral. Cardiac:  Regular rhythm, normal S1 and S2, no S3. II/VI systolic murmur. Abdomen:  Soft, non-tender. BS present. Extremities:  No edema present. No cyanosis. No clubbing. CNS: AxOx3, Cranial nerves grossly intact, moves all 4 extremities.  Skin: Warm and dry.   Intake/Output from previous day: 11/13 0701 - 11/14 0700 In: 840 [P.O.:840] Out: 200 [Urine:200]    Lab Results: BMET    Component Value Date/Time   NA 138 07/25/2020 1921   NA 137 03/17/2019 0948   NA 136 06/08/2015 0346   K 3.6 07/25/2020 1921   K 3.7 03/17/2019 0948   K 3.4 (L) 06/08/2015 0346   CL 102 07/25/2020 1921   CL 101 03/17/2019 0948   CL 101 06/08/2015 0346   CO2 24 07/25/2020 1921   CO2 28 03/17/2019 0948   CO2 29 06/08/2015 0346   GLUCOSE 112 (H) 07/25/2020 1921   GLUCOSE 102 (H) 03/17/2019 0948   GLUCOSE 108 (H) 06/08/2015 0346   BUN 11 07/25/2020 1921   BUN 11 03/17/2019 0948   BUN 13 06/08/2015 0346   CREATININE 0.88 07/26/2020 0816   CREATININE 0.84 07/25/2020 1921   CREATININE 0.66 03/17/2019 0948   CALCIUM 9.7 07/25/2020 1921   CALCIUM 9.0 03/17/2019 0948   CALCIUM 8.7  (L) 06/08/2015 0346   GFRNONAA >60 07/26/2020 0816   GFRNONAA >60 07/25/2020 1921   GFRNONAA >60 03/17/2019 0948   GFRNONAA >60 06/08/2015 0346   GFRNONAA >60 06/06/2015 0649   GFRAA >60 03/17/2019 0948   GFRAA >60 06/08/2015 0346   GFRAA >60 06/06/2015 0649   CBC    Component Value Date/Time   WBC 5.1 07/26/2020 0816   RBC 3.92 (L) 07/26/2020 0816   HGB 12.8 (L) 07/26/2020 0816   HCT 39.2 07/26/2020 0816   PLT 265 07/26/2020 0816   MCV 100.0 07/26/2020 0816   MCH 32.7 07/26/2020 0816   MCHC 32.7 07/26/2020 0816   RDW 12.8 07/26/2020 0816   LYMPHSABS 1.1 03/17/2019 0948   MONOABS 0.6 03/17/2019 0948   EOSABS 0.2 03/17/2019 0948   BASOSABS 0.0 03/17/2019 0948   HEPATIC Function Panel Recent Labs    07/25/20 1921  PROT 7.6   HEMOGLOBIN A1C No components found for: HGA1C,  MPG CARDIAC ENZYMES Lab Results  Component Value Date   TROPONINI <0.03 06/06/2015   TROPONINI <0.03 06/06/2015   TROPONINI <0.03 06/05/2015   BNP No results for input(s): PROBNP in the last 8760 hours. TSH Recent Labs    07/25/20 1921  TSH 1.607   CHOLESTEROL Recent Labs    07/26/20 0816  CHOL 111    Scheduled Meds: . amLODipine  2.5 mg Oral Daily  . aspirin EC  81 mg Oral Daily  . clopidogrel  75 mg Oral Daily  . heparin  5,000 Units Subcutaneous Q8H  . lisinopril  5 mg Oral Daily  . potassium chloride  10 mEq Oral Daily  . QUEtiapine  12.5 mg Oral QHS  . simvastatin  20 mg Oral q1800  . timolol  1 drop Left Eye BID   Continuous Infusions: . sodium chloride     PRN Meds:.  Assessment/Plan: Acute lacunar infarct Head trauma HTN Hyperlipidemia S/P stroke Moderate protein calorie malnutrition  Continue activity. Awaiting social worker placement for patient.    LOS: 2 days   Time spent including chart review, lab review, examination, discussion with patient/Nurse/Social worker : 30 min   Dixie Dials  MD  07/28/2020, 12:03 PM

## 2020-07-29 LAB — BASIC METABOLIC PANEL
Anion gap: 7 (ref 5–15)
BUN: 15 mg/dL (ref 8–23)
CO2: 27 mmol/L (ref 22–32)
Calcium: 9 mg/dL (ref 8.9–10.3)
Chloride: 104 mmol/L (ref 98–111)
Creatinine, Ser: 0.79 mg/dL (ref 0.61–1.24)
GFR, Estimated: >60 mL/min (ref >60)
Glucose, Bld: 100 mg/dL — ABNORMAL HIGH (ref 70–99)
Potassium: 3.6 mmol/L (ref 3.5–5.1)
Sodium: 138 mmol/L (ref 135–145)

## 2020-07-29 MED ORDER — MAGNESIUM SULFATE IN D5W 1-5 GM/100ML-% IV SOLN
1.0000 g | Freq: Once | INTRAVENOUS | Status: AC
  Administered 2020-07-29: 1 g via INTRAVENOUS
  Filled 2020-07-29: qty 100
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
## 2020-07-29 NOTE — Progress Notes (Signed)
Ref: Dixie Dials, MD   Subjective:  Awake. Afebrile. VS. Stable. No chest pain, headache or abdominal pain. Monitor Sinus rhythm. 6 beat junction rhythm with aberrancy  Objective:  Vital Signs in the last 24 hours: Temp:  [98.7 F (37.1 C)] 98.7 F (37.1 C) (11/14 2001) Pulse Rate:  [68] 68 (11/14 2001) Cardiac Rhythm: Sinus bradycardia;Idioventricular (11/15 0046) Resp:  [18] 18 (11/14 2001) BP: (130)/(77) 130/77 (11/14 2001) SpO2:  [99 %] 99 % (11/14 2001)  Physical Exam: BP Readings from Last 1 Encounters:  07/28/20 130/77     Wt Readings from Last 1 Encounters:  07/28/20 56.9 kg    Weight change:  Body mass index is 18.51 kg/m. HEENT: [place]/AT, Eyes-Brown, right eye artificial, Conjunctiva-Pink, Sclera-Non-icteric Neck: No JVD, No bruit, Trachea midline. Lungs:  Clear, Bilateral. Cardiac:  Regular rhythm, normal S1 and S2, no S3. II/VI systolic murmur. Abdomen:  Soft, non-tender. BS present. Extremities:  No edema present. No cyanosis. No clubbing. CNS: AxOx3, Cranial nerves grossly intact, moves all 4 extremities.  Skin: Warm and dry.   Intake/Output from previous day: 11/14 0701 - 11/15 0700 In: 100 [IV Piggyback:100] Out: 450 [Urine:450]    Lab Results: BMET    Component Value Date/Time   NA 138 07/29/2020 0508   NA 138 07/25/2020 1921   NA 137 03/17/2019 0948   K 3.6 07/29/2020 0508   K 3.6 07/25/2020 1921   K 3.7 03/17/2019 0948   CL 104 07/29/2020 0508   CL 102 07/25/2020 1921   CL 101 03/17/2019 0948   CO2 27 07/29/2020 0508   CO2 24 07/25/2020 1921   CO2 28 03/17/2019 0948   GLUCOSE 100 (H) 07/29/2020 0508   GLUCOSE 112 (H) 07/25/2020 1921   GLUCOSE 102 (H) 03/17/2019 0948   BUN 15 07/29/2020 0508   BUN 11 07/25/2020 1921   BUN 11 03/17/2019 0948   CREATININE 0.79 07/29/2020 0508   CREATININE 0.88 07/26/2020 0816   CREATININE 0.84 07/25/2020 1921   CALCIUM 9.0 07/29/2020 0508   CALCIUM 9.7 07/25/2020 1921   CALCIUM 9.0 03/17/2019  0948   GFRNONAA >60 07/29/2020 0508   GFRNONAA >60 07/26/2020 0816   GFRNONAA >60 07/25/2020 1921   GFRAA >60 03/17/2019 0948   GFRAA >60 06/08/2015 0346   GFRAA >60 06/06/2015 0649   CBC    Component Value Date/Time   WBC 5.1 07/26/2020 0816   RBC 3.92 (L) 07/26/2020 0816   HGB 12.8 (L) 07/26/2020 0816   HCT 39.2 07/26/2020 0816   PLT 265 07/26/2020 0816   MCV 100.0 07/26/2020 0816   MCH 32.7 07/26/2020 0816   MCHC 32.7 07/26/2020 0816   RDW 12.8 07/26/2020 0816   LYMPHSABS 1.1 03/17/2019 0948   MONOABS 0.6 03/17/2019 0948   EOSABS 0.2 03/17/2019 0948   BASOSABS 0.0 03/17/2019 0948   HEPATIC Function Panel Recent Labs    07/25/20 1921  PROT 7.6   HEMOGLOBIN A1C No components found for: HGA1C,  MPG CARDIAC ENZYMES Lab Results  Component Value Date   TROPONINI <0.03 06/06/2015   TROPONINI <0.03 06/06/2015   TROPONINI <0.03 06/05/2015   BNP No results for input(s): PROBNP in the last 8760 hours. TSH Recent Labs    07/25/20 1921  TSH 1.607   CHOLESTEROL Recent Labs    07/26/20 0816  CHOL 111    Scheduled Meds: . amLODipine  2.5 mg Oral Daily  . aspirin EC  81 mg Oral Daily  . clopidogrel  75 mg Oral Daily  .  heparin  5,000 Units Subcutaneous Q8H  . lisinopril  5 mg Oral Daily  . potassium chloride  10 mEq Oral Daily  . QUEtiapine  12.5 mg Oral QHS  . simvastatin  20 mg Oral q1800  . timolol  1 drop Left Eye BID   Continuous Infusions: . sodium chloride     PRN Meds:.  Assessment/Plan:   Acute lacunar infarct Head trauma HTN Hyperlipidemia S/P old stroke Moderate protein calorie malnutrition  Increase activity. Awaiting placement.   LOS: 3 days   Time spent including chart review, lab review, examination, discussion with patient : 30 min   Dixie Dials  MD  07/29/2020, 9:00 AM

## 2020-07-29 NOTE — Progress Notes (Signed)
Patient had a  run of  6 beats PVC( vtach) per cardiac monitor. Patient resting ,denies any discomforts.. Vital signs stable. Dr Doylene Canard notified, with orders for magnesium IV and lab this AM.

## 2020-07-30 MED ORDER — QUETIAPINE FUMARATE 25 MG PO TABS: 12.5000 mg | ORAL_TABLET | Freq: Two times a day (BID) | ORAL | 3 refills | Status: DC

## 2020-07-30 MED ORDER — AMLODIPINE BESYLATE 2.5 MG PO TABS: 2.5000 mg | ORAL_TABLET | Freq: Every day | ORAL | 1 refills | Status: AC
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
## 2020-07-30 NOTE — Care Management Important Message (Signed)
Important Message  Patient Details  Name: Terry Knox MRN: [id number] Date of Birth: [birth year]   Medicare Important Message Given:  Yes     Shelda Altes 07/30/2020, 9:16 AM

## 2020-07-30 NOTE — Progress Notes (Signed)
D/C instructions given and explained. Verbalized understanding. Confirmed patient has keys to the House.Obtained Taxi voucher from [place]. Transferred out via wheelchair; alert, oriented at baseline. Leveda Anna, BSN, RN

## 2020-08-01 ENCOUNTER — Encounter: Payer: Medicare HMO | Admitting: Psychology

## 2020-08-06 DIAGNOSIS — R072 Precordial pain: Secondary | ICD-10-CM | POA: Diagnosis not present

## 2020-08-06 DIAGNOSIS — I340 Nonrheumatic mitral (valve) insufficiency: Secondary | ICD-10-CM | POA: Diagnosis not present

## 2020-08-06 DIAGNOSIS — I420 Dilated cardiomyopathy: Secondary | ICD-10-CM | POA: Diagnosis not present

## 2020-08-15 ENCOUNTER — Other Ambulatory Visit: Payer: Self-pay

## 2020-08-15 ENCOUNTER — Encounter: Payer: Medicare HMO | Attending: Psychology | Admitting: Psychology

## 2020-08-15 DIAGNOSIS — F4324 Adjustment disorder with disturbance of conduct: Secondary | ICD-10-CM | POA: Diagnosis not present

## 2020-08-15 DIAGNOSIS — H5440 Blindness, one eye, unspecified eye: Secondary | ICD-10-CM

## 2020-08-15 DIAGNOSIS — I6381 Other cerebral infarction due to occlusion or stenosis of small artery: Secondary | ICD-10-CM

## 2020-08-15 DIAGNOSIS — G459 Transient cerebral ischemic attack, unspecified: Secondary | ICD-10-CM | POA: Diagnosis not present
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
## 2020-08-16 ENCOUNTER — Encounter: Payer: Self-pay | Admitting: Psychology

## 2020-08-20 ENCOUNTER — Other Ambulatory Visit: Payer: Self-pay

## 2020-08-20 ENCOUNTER — Encounter (INDEPENDENT_AMBULATORY_CARE_PROVIDER_SITE_OTHER): Payer: Self-pay | Admitting: Ophthalmology

## 2020-08-20 ENCOUNTER — Ambulatory Visit (INDEPENDENT_AMBULATORY_CARE_PROVIDER_SITE_OTHER): Payer: Medicare HMO | Admitting: Ophthalmology

## 2020-08-20 DIAGNOSIS — Z9889 Other specified postprocedural states: Secondary | ICD-10-CM

## 2020-08-20 DIAGNOSIS — H35342 Macular cyst, hole, or pseudohole, left eye: Secondary | ICD-10-CM

## 2020-08-20 DIAGNOSIS — H353124 Nonexudative age-related macular degeneration, left eye, advanced atrophic with subfoveal involvement: Secondary | ICD-10-CM | POA: Diagnosis not present

## 2020-08-20 NOTE — Progress Notes (Signed)
08/20/2020     CHIEF COMPLAINT Patient presents for Retina Follow Up   HISTORY OF PRESENT ILLNESS: Terry Knox is a 79 y.o. male who presents to the clinic today for:   HPI    Retina Follow Up    Patient presents with  Other.  In left eye.  This started 11 months ago.  Severity is mild.  Duration of 11 months.  Since onset it is stable.          Comments    11 Month F/U OS  Pt denies noticeable changes to [name] OU since last visit. Pt denies ocular pain, flashes of light, or floaters OU.         Last edited by Terry Knox, Terry Knox on 08/20/2020  1:08 PM. (History)      Referring physician: Dixie Dials, MD [place],  [place] [address]  HISTORICAL INFORMATION:   Selected notes from the MEDICAL RECORD NUMBER    Lab Results  Component Value Date   HGBA1C 5.8 (H) 07/26/2020     CURRENT MEDICATIONS: Current Outpatient Medications (Ophthalmic Drugs)  Medication Sig  . timolol (TIMOPTIC-XR) 0.5 % ophthalmic gel-forming Place 1 drop into the left eye 2 (two) times daily.   No current facility-administered medications for this visit. (Ophthalmic Drugs)   Current Outpatient Medications (Other)  Medication Sig  . albuterol (VENTOLIN HFA) 108 (90 Base) MCG/ACT inhaler Inhale into the lungs.  Marland Kitchen amLODipine (NORVASC) 2.5 MG tablet Take 1 tablet (2.5 mg total) by mouth daily.  Marland Kitchen aspirin EC 81 MG EC tablet Take 1 tablet (81 mg total) by mouth daily.  Marland Kitchen atorvastatin (LIPITOR) 40 MG tablet Take 40 mg by mouth at bedtime.  . clopidogrel (PLAVIX) 75 MG tablet Take 75 mg by mouth daily.  Marland Kitchen KLOR-CON M10 10 MEQ tablet Take 10 mEq by mouth daily.  Marland Kitchen lisinopril (ZESTRIL) 10 MG tablet Take 10 mg by mouth daily.  . QUEtiapine (SEROQUEL) 25 MG tablet Take 0.5 tablets (12.5 mg total) by mouth 2 (two) times daily.   No current facility-administered medications for this visit. (Other)      REVIEW OF SYSTEMS:    ALLERGIES No Known Allergies  PAST MEDICAL  HISTORY Past Medical History:  Diagnosis Date  . Hyperlipidemia   . Hypertension   . Stroke [facility])    Past Surgical History:  Procedure Laterality Date  . CARDIAC CATHETERIZATION N/A 06/07/2015   Procedure: Left Heart Cath and Coronary Angiography;  Surgeon: Terry Dials, MD;  Location: [facility];  Service: Cardiovascular;  Laterality: N/A;  . ENUCLEATION Right   . RUPTURED GLOBE EXPLORATION AND REPAIR Left     FAMILY HISTORY Family History  Problem Relation Age of Onset  . Hypertension Mother     SOCIAL HISTORY Social History   Tobacco Use  . Smoking status: Current Every Day Smoker    Packs/day: 1.00    Years: 60.00    Pack years: 60.00    Types: Cigarettes  . Smokeless tobacco: Never Used  Vaping Use  . Vaping Use: Never used  Substance Use Topics  . Alcohol use: No    Alcohol/week: 0.0 standard drinks  . Drug use: No         OPHTHALMIC EXAM: Base Eye Exam    Visual Acuity (ETDRS)      Right Left   Dist [place] prosthesis 20/400   Dist ph [place]  20/200       Tonometry (Tonopen, 1:08 PM)  Right Left   Pressure NA 13       Pupils      Dark Light Shape React APD   Right     NA   Left 8 8 Irregular Minimal None       Visual Fields (Counting fingers)      Left Right   Restrictions Total superior temporal deficiency   Prosthesis OD       Extraocular Movement      Right Left    prosthesis Full       Neuro/Psych    Oriented x3: Yes   Mood/Affect: Normal       Dilation    Left eye: 1.0% Mydriacyl, 2.5% Phenylephrine @ 1:13 PM        Slit Lamp and Fundus Exam    External Exam      Right Left   External Normal Normal       Slit Lamp Exam      Right Left   Lids/Lashes Normal Normal   Conjunctiva/Sclera White and quiet White and quiet   Cornea Clear Clear   Anterior Chamber Deep and quiet Deep and quiet   Iris Round and reactive Round and reactive   Lens Clear Centered posterior chamber intraocular lens   Anterior Vitreous  Normal Normal       Fundus Exam      Right Left   Posterior Vitreous  Clear, vitrectomized   Disc  Normal   C/D Ratio  0.4   Macula  Geographic atrophy, macular hole closed   Vessels  Normal   Periphery  Peripheral bone spicule pigmentation superiorly, no          IMAGING AND PROCEDURES  Imaging and Procedures for 08/20/20  OCT, Retina - OU - Both Eyes       Left Eye Quality was good. Scan locations included subfoveal. Central Foveal Thickness: 199. Progression has been stable. Findings include no SRF, abnormal foveal contour, outer retinal atrophy, central retinal atrophy.   Notes Status post vitrectomy membrane peel, closure of macular hole from 2020, excellent visual acuity recovery yet limited by subfoveal geographic RPE atrophy and chronic macular hole  Outer retinal atrophy  OD prosthesis                ASSESSMENT/PLAN:  Macular hole, left eye Surgically corrected, repair 2020, visual acuity limited by outer RPE atrophy from ARMD  Advanced nonexudative age-related macular degeneration of left eye with subfoveal involvement Stable no CNVM      ICD-10-CM   1. Advanced nonexudative age-related macular degeneration of left eye with subfoveal involvement  H35.3124 OCT, Retina - OU - Both Eyes  2. History of vitrectomy  Z98.890   3. Macular hole, left eye  H35.342     1. Dry ARMD OS, limits acuity, stable overall, improved overall post vitrectomy membrane peel for macular hole and its repair 2020  2.  3.  Ophthalmic Meds Ordered this visit:  No orders of the defined types were placed in this encounter.      Return in about 1 year (around 08/20/2021) for dilate, OS, OCT.  There are no Patient Instructions on file for this visit.   Explained the diagnoses, plan, and follow up with the patient and they expressed understanding.  Patient expressed understanding of the importance of proper follow up care.   Terry Knox M.D. Diseases & Surgery of  the Retina and Vitreous [facility] 08/20/20     Abbreviations:  M myopia (nearsighted); A astigmatism; H hyperopia (farsighted); P presbyopia; Mrx spectacle prescription;  CTL contact lenses; OD right eye; OS left eye; OU both eyes  XT exotropia; ET esotropia; PEK punctate epithelial keratitis; PEE punctate epithelial erosions; DES dry eye syndrome; MGD meibomian gland dysfunction; ATs artificial tears; PFAT's preservative free artificial tears; [place] nuclear sclerotic cataract; PSC posterior subcapsular cataract; ERM epi-retinal membrane; PVD posterior vitreous detachment; RD retinal detachment; DM diabetes mellitus; DR diabetic retinopathy; NPDR non-proliferative diabetic retinopathy; PDR proliferative diabetic retinopathy; CSME clinically significant macular edema; DME diabetic macular edema; dbh dot blot hemorrhages; CWS cotton wool spot; POAG primary open angle glaucoma; C/D cup-to-disc ratio; HVF humphrey visual field; GVF goldmann visual field; OCT optical coherence tomography; IOP intraocular pressure; BRVO Branch retinal vein occlusion; CRVO central retinal vein occlusion; CRAO central retinal artery occlusion; BRAO branch retinal artery occlusion; RT retinal tear; SB scleral buckle; PPV pars plana vitrectomy; VH Vitreous hemorrhage; PRP panretinal laser photocoagulation; IVK intravitreal kenalog; VMT vitreomacular traction; MH Macular hole;  NVD neovascularization of the disc; NVE neovascularization elsewhere; AREDS age related eye disease study; ARMD age related macular degeneration; POAG primary open angle glaucoma; EBMD epithelial/anterior basement membrane dystrophy; ACIOL anterior chamber intraocular lens; IOL intraocular lens; PCIOL posterior chamber intraocular lens; Phaco/IOL phacoemulsification with intraocular lens placement; [place] photorefractive keratectomy; LASIK laser assisted in situ keratomileusis; HTN hypertension; DM diabetes mellitus; COPD chronic obstructive  pulmonary disease

## 2020-08-20 NOTE — Assessment & Plan Note (Signed)
Surgically corrected, repair 2020, visual acuity limited by outer RPE atrophy from ARMD

## 2020-08-20 NOTE — Assessment & Plan Note (Signed)
Stable no CNVM

## 2020-08-27 ENCOUNTER — Inpatient Hospital Stay: Payer: Medicare HMO | Admitting: Adult Health

## 2020-09-25 ENCOUNTER — Encounter: Payer: Self-pay | Admitting: Adult Health

## 2020-09-25 ENCOUNTER — Ambulatory Visit (INDEPENDENT_AMBULATORY_CARE_PROVIDER_SITE_OTHER): Payer: Medicare HMO | Admitting: Adult Health

## 2020-09-25 ENCOUNTER — Other Ambulatory Visit: Payer: Self-pay

## 2020-09-25 VITALS — BP 139/74 | HR 66 | Ht 69.0 in | Wt 134.2 lb

## 2020-09-25 DIAGNOSIS — I639 Cerebral infarction, unspecified: Secondary | ICD-10-CM

## 2020-09-25 NOTE — Patient Instructions (Signed)
Continue aspirin 81 mg daily and clopidogrel 75 mg daily  and lipitor  for secondary stroke prevention  Very important to quit tobacco use as this will greatly increase your risk of additional strokes in the future - you can discuss further with your PCP if needed  Continue to follow up with PCP regarding cholesterol and blood pressure management  Maintain strict control of hypertension with blood pressure goal below 130/90 and cholesterol with LDL cholesterol (bad cholesterol) goal below 70 mg/dL.       Followup in the future with me in 6 months or call earlier if needed       Thank you for coming to see [place] at [facility]. I hope we have been able to provide you high quality care today.  You may receive a patient satisfaction survey over the next few weeks. We would appreciate your feedback and comments so that we may continue to improve ourselves and the health of our patients.
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
## 2020-09-26 NOTE — Progress Notes (Signed)
I agree with the above plan 

## 2020-10-07 DIAGNOSIS — E440 Moderate protein-calorie malnutrition: Secondary | ICD-10-CM | POA: Diagnosis not present

## 2020-10-07 DIAGNOSIS — E7849 Other hyperlipidemia: Secondary | ICD-10-CM | POA: Diagnosis not present

## 2020-10-07 DIAGNOSIS — J449 Chronic obstructive pulmonary disease, unspecified: Secondary | ICD-10-CM | POA: Diagnosis not present

## 2020-10-07 DIAGNOSIS — I10 Essential (primary) hypertension: Secondary | ICD-10-CM | POA: Diagnosis not present

## 2020-10-31 ENCOUNTER — Other Ambulatory Visit: Payer: Self-pay

## 2020-10-31 ENCOUNTER — Encounter: Payer: Medicare HMO | Attending: Psychology | Admitting: Psychology

## 2020-10-31 DIAGNOSIS — F4324 Adjustment disorder with disturbance of conduct: Secondary | ICD-10-CM

## 2020-10-31 DIAGNOSIS — G459 Transient cerebral ischemic attack, unspecified: Secondary | ICD-10-CM | POA: Diagnosis not present

## 2020-10-31 DIAGNOSIS — H5440 Blindness, one eye, unspecified eye: Secondary | ICD-10-CM

## 2020-10-31 DIAGNOSIS — I6381 Other cerebral infarction due to occlusion or stenosis of small artery: Secondary | ICD-10-CM | POA: Diagnosis not present

## 2020-11-22 ENCOUNTER — Encounter: Payer: Self-pay | Admitting: Psychology
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
## 2021-01-02 DIAGNOSIS — H401123 Primary open-angle glaucoma, left eye, severe stage: Secondary | ICD-10-CM | POA: Diagnosis not present

## 2021-02-03 ENCOUNTER — Encounter: Payer: Medicare HMO | Attending: Psychology | Admitting: Psychology

## 2021-02-03 ENCOUNTER — Other Ambulatory Visit: Payer: Self-pay

## 2021-02-03 DIAGNOSIS — F4324 Adjustment disorder with disturbance of conduct: Secondary | ICD-10-CM | POA: Diagnosis not present

## 2021-02-03 DIAGNOSIS — H5440 Blindness, one eye, unspecified eye: Secondary | ICD-10-CM | POA: Insufficient documentation

## 2021-02-03 DIAGNOSIS — I6381 Other cerebral infarction due to occlusion or stenosis of small artery: Secondary | ICD-10-CM | POA: Insufficient documentation

## 2021-02-03 DIAGNOSIS — G459 Transient cerebral ischemic attack, unspecified: Secondary | ICD-10-CM | POA: Insufficient documentation

## 2021-02-17 ENCOUNTER — Encounter: Payer: Self-pay | Admitting: Psychology
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
## 2021-03-26 ENCOUNTER — Ambulatory Visit (INDEPENDENT_AMBULATORY_CARE_PROVIDER_SITE_OTHER): Payer: Medicare HMO | Admitting: Adult Health

## 2021-03-26 ENCOUNTER — Other Ambulatory Visit: Payer: Self-pay

## 2021-03-26 ENCOUNTER — Encounter: Payer: Self-pay | Admitting: Adult Health

## 2021-03-26 VITALS — BP 116/63 | HR 62 | Ht 69.0 in | Wt 122.4 lb

## 2021-03-26 DIAGNOSIS — I10 Essential (primary) hypertension: Secondary | ICD-10-CM | POA: Diagnosis not present

## 2021-03-26 DIAGNOSIS — E785 Hyperlipidemia, unspecified: Secondary | ICD-10-CM

## 2021-03-26 DIAGNOSIS — I639 Cerebral infarction, unspecified: Secondary | ICD-10-CM

## 2021-03-26 DIAGNOSIS — Z720 Tobacco use: Secondary | ICD-10-CM | POA: Diagnosis not present
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
## 2021-03-26 NOTE — Patient Instructions (Signed)
Continue aspirin 81 mg daily and clopidogrel 75 mg daily  and atorvastatin for secondary stroke prevention  Continue to follow up with PCP regarding cholesterol and blood pressure management  Maintain strict control of hypertension with blood pressure goal below 130/90 and cholesterol with LDL cholesterol (bad cholesterol) goal below 70 mg/dL.       Followup in the future with me in 6 months or call earlier if needed       Thank you for coming to see [place] at [facility]. I hope we have been able to provide you high quality care today.  You may receive a patient satisfaction survey over the next few weeks. We would appreciate your feedback and comments so that we may continue to improve ourselves and the health of our patients.

## 2021-03-27 NOTE — Progress Notes (Signed)
I agree with the above plan 

## 2021-04-07 DIAGNOSIS — I10 Essential (primary) hypertension: Secondary | ICD-10-CM | POA: Diagnosis not present

## 2021-04-07 DIAGNOSIS — E440 Moderate protein-calorie malnutrition: Secondary | ICD-10-CM | POA: Diagnosis not present

## 2021-04-07 DIAGNOSIS — I2510 Atherosclerotic heart disease of native coronary artery without angina pectoris: Secondary | ICD-10-CM | POA: Diagnosis not present

## 2021-04-07 DIAGNOSIS — I340 Nonrheumatic mitral (valve) insufficiency: Secondary | ICD-10-CM | POA: Diagnosis not present

## 2021-04-07 DIAGNOSIS — H906 Mixed conductive and sensorineural hearing loss, bilateral: Secondary | ICD-10-CM | POA: Diagnosis not present

## 2021-04-07 DIAGNOSIS — M25552 Pain in left hip: Secondary | ICD-10-CM | POA: Diagnosis not present

## 2021-04-07 DIAGNOSIS — H40062 Primary angle closure without glaucoma damage, left eye: Secondary | ICD-10-CM | POA: Diagnosis not present

## 2021-05-26 DIAGNOSIS — M1611 Unilateral primary osteoarthritis, right hip: Secondary | ICD-10-CM | POA: Diagnosis not present

## 2021-05-26 DIAGNOSIS — M545 Low back pain, unspecified: Secondary | ICD-10-CM | POA: Diagnosis not present

## 2021-07-08 DIAGNOSIS — I10 Essential (primary) hypertension: Secondary | ICD-10-CM | POA: Diagnosis not present

## 2021-07-08 DIAGNOSIS — M25551 Pain in right hip: Secondary | ICD-10-CM | POA: Diagnosis not present

## 2021-07-08 DIAGNOSIS — I2510 Atherosclerotic heart disease of native coronary artery without angina pectoris: Secondary | ICD-10-CM | POA: Diagnosis not present

## 2021-07-08 DIAGNOSIS — E440 Moderate protein-calorie malnutrition: Secondary | ICD-10-CM | POA: Diagnosis not present

## 2021-07-16 DIAGNOSIS — M1611 Unilateral primary osteoarthritis, right hip: Secondary | ICD-10-CM | POA: Diagnosis not present

## 2021-07-21 DIAGNOSIS — H401123 Primary open-angle glaucoma, left eye, severe stage: Secondary | ICD-10-CM | POA: Diagnosis not present

## 2021-07-21 DIAGNOSIS — R6889 Other general symptoms and signs: Secondary | ICD-10-CM | POA: Diagnosis not present

## 2021-07-21 DIAGNOSIS — Z961 Presence of intraocular lens: Secondary | ICD-10-CM | POA: Diagnosis not present

## 2021-07-23 ENCOUNTER — Telehealth: Payer: Self-pay | Admitting: *Deleted

## 2021-07-23 NOTE — Telephone Encounter (Signed)
Received surgical clearance letter form [facility] re: total right hip replacement. Completed and signed by Janett Billow NP, faxed to [facility]. Received confirmation.

## 2021-08-26 ENCOUNTER — Encounter (INDEPENDENT_AMBULATORY_CARE_PROVIDER_SITE_OTHER): Payer: Medicare HMO | Admitting: Ophthalmology

## 2021-08-27 DIAGNOSIS — R6889 Other general symptoms and signs: Secondary | ICD-10-CM | POA: Diagnosis not present

## 2021-09-09 ENCOUNTER — Encounter (INDEPENDENT_AMBULATORY_CARE_PROVIDER_SITE_OTHER): Payer: Medicare HMO | Admitting: Ophthalmology

## 2021-09-14 DIAGNOSIS — I639 Cerebral infarction, unspecified: Secondary | ICD-10-CM

## 2021-09-14 HISTORY — DX: Cerebral infarction, unspecified: I63.9

## 2021-09-18 ENCOUNTER — Encounter (INDEPENDENT_AMBULATORY_CARE_PROVIDER_SITE_OTHER): Payer: Medicare HMO | Admitting: Ophthalmology

## 2021-10-06 ENCOUNTER — Encounter: Payer: Self-pay | Admitting: Adult Health

## 2021-10-06 ENCOUNTER — Ambulatory Visit (INDEPENDENT_AMBULATORY_CARE_PROVIDER_SITE_OTHER): Payer: Medicare HMO | Admitting: Adult Health

## 2021-10-06 VITALS — BP 160/78 | HR 61 | Ht 69.0 in | Wt 132.0 lb

## 2021-10-06 DIAGNOSIS — I639 Cerebral infarction, unspecified: Secondary | ICD-10-CM | POA: Diagnosis not present

## 2021-10-06 DIAGNOSIS — R200 Anesthesia of skin: Secondary | ICD-10-CM

## 2021-10-06 NOTE — Patient Instructions (Addendum)
Continue aspirin 81 mg daily and clopidogrel 75 mg daily  and atorvastatin  for secondary stroke prevention  Continue to follow up with PCP regarding cholesterol and blood pressure management  Maintain strict control of hypertension with blood pressure goal below 130/90 and cholesterol with LDL cholesterol (bad cholesterol) goal below 70 mg/dL.   Signs of a Stroke? Follow the BEFAST method:  Balance Watch for a sudden loss of balance, trouble with coordination or vertigo Eyes Is there a sudden loss of vision in one or both eyes? Or double vision?  Face: Ask the person to smile. Does one side of the face droop or is it numb?  Arms: Ask the person to raise both arms. Does one arm drift downward? Is there weakness or numbness of a leg? Speech: Ask the person to repeat a simple phrase. Does the speech sound slurred/strange? Is the person confused ? Time: If you observe any of these signs, call 911.  Ensure you follow up with your PCP on Wednesday and discuss shoulder concerns - if this persists, we can discuss doing EMG/NCV but as recent onset, unsure of benefit at this time.  If this should worsen or you start to get numbness/tingling in leg or face or any weakness, call 911 immediately for further evaluation          Thank you for coming to see [place] at [facility]. I hope we have been able to provide you high quality care today.  You may receive a patient satisfaction survey over the next few weeks. We would appreciate your feedback and comments so that we may continue to improve ourselves and the health of our patients.
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
## 2021-10-08 DIAGNOSIS — I2510 Atherosclerotic heart disease of native coronary artery without angina pectoris: Secondary | ICD-10-CM | POA: Diagnosis not present

## 2021-10-08 DIAGNOSIS — E440 Moderate protein-calorie malnutrition: Secondary | ICD-10-CM | POA: Diagnosis not present

## 2021-10-08 DIAGNOSIS — I10 Essential (primary) hypertension: Secondary | ICD-10-CM | POA: Diagnosis not present

## 2021-10-08 DIAGNOSIS — M25551 Pain in right hip: Secondary | ICD-10-CM | POA: Diagnosis not present

## 2021-10-13 ENCOUNTER — Encounter (INDEPENDENT_AMBULATORY_CARE_PROVIDER_SITE_OTHER): Payer: Medicare HMO | Admitting: Ophthalmology

## 2021-10-29 ENCOUNTER — Ambulatory Visit (INDEPENDENT_AMBULATORY_CARE_PROVIDER_SITE_OTHER): Payer: Medicare HMO | Admitting: Ophthalmology

## 2021-10-29 ENCOUNTER — Encounter (INDEPENDENT_AMBULATORY_CARE_PROVIDER_SITE_OTHER): Payer: Self-pay | Admitting: Ophthalmology

## 2021-10-29 ENCOUNTER — Other Ambulatory Visit: Payer: Self-pay

## 2021-10-29 DIAGNOSIS — H35342 Macular cyst, hole, or pseudohole, left eye: Secondary | ICD-10-CM | POA: Diagnosis not present

## 2021-10-29 DIAGNOSIS — Z9889 Other specified postprocedural states: Secondary | ICD-10-CM | POA: Insufficient documentation

## 2021-10-29 DIAGNOSIS — Z961 Presence of intraocular lens: Secondary | ICD-10-CM

## 2021-10-29 DIAGNOSIS — H353124 Nonexudative age-related macular degeneration, left eye, advanced atrophic with subfoveal involvement: Secondary | ICD-10-CM | POA: Diagnosis not present

## 2021-10-29 NOTE — Progress Notes (Signed)
10/29/2021     CHIEF COMPLAINT Patient presents for  Chief Complaint  Patient presents with   Macular Degeneration      HISTORY OF PRESENT ILLNESS: Terry Knox is a 81 y.o. male who presents to the clinic today for:   HPI   1 yr fu OS oct. Patient states vision is stable and unchanged since last visit. Denies any new floaters or FOL. Pt states "ask the doc if I should try glasses again." Timolol BID OS. Last edited by Laurin Coder on 10/29/2021  2:53 PM.      Referring physician: Rutherford Guys, [place],  [place] [address]  HISTORICAL INFORMATION:   Selected notes from the MEDICAL RECORD NUMBER    Lab Results  Component Value Date   HGBA1C 5.8 (H) 07/26/2020     CURRENT MEDICATIONS: Current Outpatient Medications (Ophthalmic Drugs)  Medication Sig   timolol (TIMOPTIC-XR) 0.5 % ophthalmic gel-forming Place 1 drop into the left eye 2 (two) times daily.   No current facility-administered medications for this visit. (Ophthalmic Drugs)   Current Outpatient Medications (Other)  Medication Sig   acetaminophen (TYLENOL) 325 MG tablet Take 650 mg by mouth every 6 (six) hours as needed.   albuterol (VENTOLIN HFA) 108 (90 Base) MCG/ACT inhaler Inhale into the lungs.   amLODipine (NORVASC) 2.5 MG tablet Take 1 tablet (2.5 mg total) by mouth daily.   aspirin EC 81 MG EC tablet Take 1 tablet (81 mg total) by mouth daily.   atorvastatin (LIPITOR) 40 MG tablet Take 40 mg by mouth at bedtime.   clopidogrel (PLAVIX) 75 MG tablet Take 75 mg by mouth daily.   KLOR-CON M10 10 MEQ tablet Take 10 mEq by mouth daily.   lisinopril (ZESTRIL) 10 MG tablet Take 10 mg by mouth daily.   QUEtiapine (SEROQUEL) 25 MG tablet Take 0.5 tablets (12.5 mg total) by mouth 2 (two) times daily.   No current facility-administered medications for this visit. (Other)      REVIEW OF SYSTEMS: ROS   Negative for: Constitutional, Gastrointestinal, Neurological, Skin,  Genitourinary, Musculoskeletal, HENT, Endocrine, Cardiovascular, Eyes, Respiratory, Psychiatric, Allergic/Imm, Heme/Lymph Last edited by Hurman Horn, MD on 10/29/2021  3:18 PM.       ALLERGIES No Known Allergies  PAST MEDICAL HISTORY Past Medical History:  Diagnosis Date   Hyperlipidemia    Hypertension    Stroke [facility])    Past Surgical History:  Procedure Laterality Date   CARDIAC CATHETERIZATION N/A 06/07/2015   Procedure: Left Heart Cath and Coronary Angiography;  Surgeon: Dixie Dials, MD;  Location: [facility];  Service: Cardiovascular;  Laterality: N/A;   ENUCLEATION Right    RUPTURED GLOBE EXPLORATION AND REPAIR Left     FAMILY HISTORY Family History  Problem Relation Age of Onset   Hypertension Mother     SOCIAL HISTORY Social History   Tobacco Use   Smoking status: Every Day    Packs/day: 1.00    Years: 60.00    Pack years: 60.00    Types: Cigarettes   Smokeless tobacco: Never  Vaping Use   Vaping Use: Never used  Substance Use Topics   Alcohol use: No    Alcohol/week: 0.0 standard drinks   Drug use: No         OPHTHALMIC EXAM:  Base Eye Exam     Visual Acuity (ETDRS)       Right Left   Dist [place] Prosthesis CF at 4'  Dist ph [place]  NI         Tonometry (Tonopen, 2:54 PM)       Right Left   Pressure Prosthesis 13         Pupils       Dark Shape React APD   Right Prosthesis      Left  Irregular Minimal None         Visual Fields (Counting fingers)       Left Right   Restrictions Total superior temporal deficiency   OD Prosethesis.        Neuro/Psych     Oriented x3: Yes   Mood/Affect: Normal         Dilation     Left eye: 1.0% Mydriacyl, 2.5% Phenylephrine @ 2:54 PM           Slit Lamp and Fundus Exam     External Exam       Right Left   External Normal Normal         Slit Lamp Exam       Right Left   Lids/Lashes  Normal   Conjunctiva/Sclera  White and quiet   Cornea Prosthesis Clear    Anterior Chamber  Deep and quiet   [place]ris  Round and reactive   Lens  Anterior chamber intraocular lens, Centered anterior chamber intraocular lens   Anterior Vitreous  Normal         Fundus Exam       Right Left   Posterior Vitreous  Clear, vitrectomized   Disc  Normal   C/D Ratio  0.4   Macula  Geographic atrophy, macular hole closed, pigmentary hyperpigmentation   Vessels  Normal   Periphery  Peripheral bone spicule pigmentation superiorly, no holes or tears, retina attached            IMAGING AND PROCEDURES  Imaging and Procedures for 10/29/21  OCT, Retina - OU - Both Eyes       Left Eye Quality was borderline. Scan locations included subfoveal. Central Foveal Thickness: 199. Progression has been stable. Findings include no SRF, abnormal foveal contour, outer retinal atrophy, central retinal atrophy.   Notes Status post vitrectomy membrane peel, closure of macular hole from 2020, excellent visual acuity recovery yet limited by subfoveal geographic RPE atrophy and chronic macular hole  Outer retinal atrophy  OD prosthesis             ASSESSMENT/PLAN:  Macular hole, left eye Macular hole OS remains closed, there are pigmentary changes and geographic atrophy changes of dry age-related macular degeneration  Advanced nonexudative age-related macular degeneration of left eye with subfoveal involvement Centrally OS limits acuity  Pseudophakia, left eye AC IOL OS stable  History of vitrectomy 2020, macular hole closure successful      ICD-10-CM   1. Advanced nonexudative age-related macular degeneration of left eye with subfoveal involvement  H35.3124 OCT, Retina - OU - Both Eyes    2. Macular hole, left eye  H35.342     3. Pseudophakia, left eye  Z96.1     4. History of vitrectomy  Z98.890       1.  OS looks great, no active disease, with dry atrophic AMD present OS centrally  2.  History of macular hole OS stable hole closed  3.  Patient  interested in having measurements for glasses, he will follow-up and schedule Dr. Rutherford Guys to see if any spectacle correction may be of some assistance in his visual  needs  I explained to the patient that dry age-related macular degeneration is involving the center of his vision and often cannot be corrected With refraction  Ophthalmic Meds Ordered this visit:  No orders of the defined types were placed in this encounter.      Return in about 18 months (around 04/29/2023) for dilate, OS, COLOR FP, OCT.  There are no Patient Instructions on file for this visit.   Explained the diagnoses, plan, and follow up with the patient and they expressed understanding.  Patient expressed understanding of the importance of proper follow up care.   Terry Knox M.D. Diseases & Surgery of the Retina and Vitreous [facility] 10/29/21     Abbreviations: M myopia (nearsighted); A astigmatism; H hyperopia (farsighted); P presbyopia; Mrx spectacle prescription;  CTL contact lenses; OD right eye; OS left eye; OU both eyes  XT exotropia; ET esotropia; PEK punctate epithelial keratitis; PEE punctate epithelial erosions; DES dry eye syndrome; MGD meibomian gland dysfunction; ATs artificial tears; PFAT's preservative free artificial tears; [place] nuclear sclerotic cataract; PSC posterior subcapsular cataract; ERM epi-retinal membrane; PVD posterior vitreous detachment; RD retinal detachment; DM diabetes mellitus; DR diabetic retinopathy; NPDR non-proliferative diabetic retinopathy; PDR proliferative diabetic retinopathy; CSME clinically significant macular edema; DME diabetic macular edema; dbh dot blot hemorrhages; CWS cotton wool spot; POAG primary open angle glaucoma; C/D cup-to-disc ratio; HVF humphrey visual field; GVF goldmann visual field; OCT optical coherence tomography; IOP intraocular pressure; BRVO Branch retinal vein occlusion; CRVO central retinal vein occlusion; CRAO central retinal  artery occlusion; BRAO branch retinal artery occlusion; RT retinal tear; SB scleral buckle; PPV pars plana vitrectomy; VH Vitreous hemorrhage; PRP panretinal laser photocoagulation; IVK intravitreal kenalog; VMT vitreomacular traction; MH Macular hole;  NVD neovascularization of the disc; NVE neovascularization elsewhere; AREDS age related eye disease study; ARMD age related macular degeneration; POAG primary open angle glaucoma; EBMD epithelial/anterior basement membrane dystrophy; ACIOL anterior chamber intraocular lens; IOL intraocular lens; PCIOL posterior chamber intraocular lens; Phaco/IOL phacoemulsification with intraocular lens placement; [place] photorefractive keratectomy; LASIK laser assisted in situ keratomileusis; HTN hypertension; DM diabetes mellitus; COPD chronic obstructive pulmonary disease

## 2021-10-29 NOTE — Assessment & Plan Note (Signed)
AC IOL OS stable

## 2021-10-29 NOTE — Assessment & Plan Note (Signed)
2020, macular hole closure successful

## 2021-10-29 NOTE — Assessment & Plan Note (Signed)
Centrally OS limits acuity

## 2021-10-29 NOTE — Assessment & Plan Note (Signed)
Macular hole OS remains closed, there are pigmentary changes and geographic atrophy changes of dry age-related macular degeneration

## 2021-11-11 NOTE — Progress Notes (Signed)
Sent message, via epic in basket, requesting orders in epic from surgeon.  

## 2021-11-17 DIAGNOSIS — M1611 Unilateral primary osteoarthritis, right hip: Secondary | ICD-10-CM | POA: Diagnosis not present

## 2021-11-18 DIAGNOSIS — M1611 Unilateral primary osteoarthritis, right hip: Secondary | ICD-10-CM | POA: Diagnosis present

## 2021-11-18 NOTE — Patient Instructions (Addendum)
DUE TO COVID-19 ONLY ONE VISITOR IS ALLOWED TO COME WITH YOU AND STAY IN THE WAITING ROOM ONLY DURING PRE OP AND PROCEDURE.   **NO VISITORS ARE ALLOWED IN THE SHORT STAY AREA OR RECOVERY ROOM!!**  IF YOU WILL BE ADMITTED INTO THE HOSPITAL YOU ARE ALLOWED ONLY TWO SUPPORT PEOPLE DURING VISITATION HOURS ONLY (7 AM -8PM)   The support person(s) must pass our screening, gel in and out, and wear a mask at all times, including in the patients room. Patients must also wear a mask when staff or their support person are in the room. Visitors GUEST BADGE MUST BE WORN VISIBLY  One adult visitor may remain with you overnight and MUST be in the room by 8 P.M. No visitors under the age of 64. Any visitor under the age of 45 must be accompanied by an adult.      COVID SWAB TESTING MUST BE COMPLETED ON:  11/28/21 at 9:45am    (*ARRIVE AT YOUR APPOINTMENT TIME STAFF IS NOT HERE BEFORE 8AM!!!*)    Site: [facility] [address] [place] [address] Enter: Main Entrance have a seat in the waiting area to the right of main entrance (DO NOT [place]!!!!!) Dial: [phone] to alert staff you have arrived  You are not required to quarantine, however you are required to wear a well-fitted mask when you are out and around people not in your household.  Hand Hygiene often Do NOT share personal items Notify your provider if you are in close contact with someone who has COVID or you develop fever 100.4 or greater, new onset of sneezing, cough, sore throat, shortness of breath or body aches.  [place], [address], must go inside of the hospital, NOT A DRIVE THRU!  (Must self quarantine after testing. Follow instructions on handout.)         Your procedure is scheduled on: 12/02/21   Report to [facility] Main Entrance    Report to short stay at 5:15 AM   Call this number if you have problems the morning of  surgery [phone]   Do not eat food :After Midnight.   After Midnight you may have the following liquids until __4:30____ AM DAY OF SURGERY  Water Black Coffee (sugar ok, NO MILK/CREAM OR CREAMERS)  Tea (sugar ok, NO MILK/CREAM OR CREAMERS) regular and decaf                             Plain Jell-O (NO RED)                                           Fruit ices (not with fruit pulp, NO RED)                                     Popsicles (NO RED)  Juice: apple, WHITE grape, WHITE cranberry Sports drinks like Gatorade (NO RED) Clear broth(vegetable,chicken,beef)                    The day of surgery:  Drink ONE (1) Pre-Surgery Clear Ensure  at  4:15 AM the morning of surgery. Drink in one sitting. Do not sip.  This drink was given to you during your hospital  pre-op appointment visit. Nothing else to drink after completing the  Pre-Surgery Clear Ensure at 4:30 AM          If you have questions, please contact your surgeons office.   FOLLOW BOWEL PREP AND ANY ADDITIONAL PRE OP INSTRUCTIONS YOU RECEIVED FROM YOUR SURGEON'S OFFICE!!!     Oral Hygiene is also important to reduce your risk of infection.                                    Remember - BRUSH YOUR TEETH THE MORNING OF SURGERY WITH YOUR REGULAR TOOTHPASTE    Take these medicines the morning of surgery with A SIP OF WATER:  Amlodipine, use eye drops. And Inhalers and bring them with you  Stop plavix on 11/26/21                              You may not have any metal on your body including hair pins, jewelry, and body piercing             Do not wear make-up, lotions, powders, perfumes/cologne, or deodorant   Do not shave  48 hours prior to surgery.               Men may shave face and neck.   Do not bring valuables to the hospital. [place].   Contacts, dentures or bridgework may not be worn into  surgery.   Bring small overnight bag day of surgery.    Patients discharged on the day of surgery will not be allowed to drive home.  Someone NEEDS to stay with you for the first 24 hours after anesthesia.   Special Instructions: Bring a copy of your healthcare power of attorney and living will documents the day of surgery if you haven't scanned them before.              Please read over the following fact sheets you were given: IF YOU HAVE QUESTIONS ABOUT YOUR PRE-OP INSTRUCTIONS PLEASE CALL [phone]     [facility] - Preparing for Surgery Before surgery, you can play an important role.  Because skin is not sterile, your skin needs to be as free of germs as possible.  You can reduce the number of germs on your skin by washing with CHG (chlorahexidine gluconate) soap before surgery.  CHG is an antiseptic cleaner which kills germs and bonds with the skin to continue killing germs even after washing. Please DO NOT use if you have an allergy to CHG or antibacterial soaps.  If your skin becomes reddened/irritated stop using the CHG and inform your nurse when you arrive at Short Stay.  You may shave your face/neck.  Please follow these instructions carefully:  1.  Shower with CHG Soap the night before surgery and the  morning of Surgery.  2.  If you choose to wash your hair, wash your hair first as usual with your  normal  shampoo.  3.  After you shampoo, rinse your hair and body thoroughly to remove the  shampoo.                                       4.  Use CHG as you would any other liquid soap.  You can apply chg directly  to the skin and wash                       Gently with a scrungie or clean washcloth.  5.  Apply the CHG Soap to your body ONLY FROM THE NECK DOWN.   Do not use on face/ open                           Wound or open sores. Avoid contact with eyes, ears mouth and genitals (private parts).                       Wash face,  Genitals (private parts) with your normal soap.              6.  Wash thoroughly, paying special attention to the area where your surgery  will be performed.  7.  Thoroughly rinse your body with warm water from the neck down.  8.  DO NOT shower/wash with your normal soap after using and rinsing off  the CHG Soap.                9.  Pat yourself dry with a clean towel.            10.  Wear clean pajamas.            11.  Place clean sheets on your bed the night of your first shower and do not  sleep with pets. Day of Surgery : Do not apply any lotions/deodorants the morning of surgery.  Please wear clean clothes to the hospital/surgery center.  FAILURE TO FOLLOW THESE INSTRUCTIONS MAY RESULT IN THE CANCELLATION OF YOUR SURGERY   ________________________________________________________________________   Incentive Spirometer  An incentive spirometer is a tool that can help keep your lungs clear and active. This tool measures how well you are filling your lungs with each breath. Taking long deep breaths may help reverse or decrease the chance of developing breathing (pulmonary) problems (especially infection) following: A long period of time when you are unable to move or be active. BEFORE THE PROCEDURE  If the spirometer includes an indicator to show your best effort, your nurse or respiratory therapist will set it to a desired goal. If possible, sit up straight or lean slightly forward. Try not to slouch. Hold the incentive spirometer in an upright position. INSTRUCTIONS FOR USE  Sit on the edge of your bed if possible, or sit up as far as you can in bed or on a chair. Hold the incentive spirometer in an upright position. Breathe out normally. Place the mouthpiece in your mouth and seal your lips tightly around it. Breathe in slowly and as deeply as possible, raising the piston or the ball toward the top of the column. Hold your breath for 3-5 seconds or for as long as possible. Allow the piston or ball to fall to  the bottom of the  column. Remove the mouthpiece from your mouth and breathe out normally. Rest for a few seconds and repeat Steps 1 through 7 at least 10 times every 1-2 hours when you are awake. Take your time and take a few normal breaths between deep breaths. The spirometer may include an indicator to show your best effort. Use the indicator as a goal to work toward during each repetition. After each set of 10 deep breaths, practice coughing to be sure your lungs are clear. If you have an incision (the cut made at the time of surgery), support your incision when coughing by placing a pillow or rolled up towels firmly against it. Once you are able to get out of bed, walk around indoors and cough well. You may stop using the incentive spirometer when instructed by your caregiver.  RISKS AND COMPLICATIONS Take your time so you do not get dizzy or light-headed. If you are in pain, you may need to take or ask for pain medication before doing incentive spirometry. It is harder to take a deep breath if you are having pain. AFTER USE Rest and breathe slowly and easily. It can be helpful to keep track of a log of your progress. Your caregiver can provide you with a simple table to help with this. If you are using the spirometer at home, follow these instructions: [place] IF:  You are having difficultly using the spirometer. You have trouble using the spirometer as often as instructed. Your pain medication is not giving enough relief while using the spirometer. You develop fever of 100.5 F (38.1 C) or higher. SEEK IMMEDIATE MEDICAL CARE IF:  You cough up bloody sputum that had not been present before. You develop fever of 102 F (38.9 C) or greater. You develop worsening pain at or near the incision site. MAKE SURE YOU:  Understand these instructions. Will watch your condition. Will get help right away if you are not doing well or get worse. Document Released: 01/11/2007 Document Revised: 11/23/2011  Document Reviewed: 03/14/2007 [facility] Patient Information 2014 [place], [place].   ________________________________________________________________________

## 2021-11-19 ENCOUNTER — Encounter (HOSPITAL_COMMUNITY): Payer: Self-pay

## 2021-11-19 ENCOUNTER — Other Ambulatory Visit: Payer: Self-pay

## 2021-11-19 ENCOUNTER — Encounter (HOSPITAL_COMMUNITY)
Admission: RE | Admit: 2021-11-19 | Discharge: 2021-11-19 | Disposition: A | Discharge status: Home / Self Care | Payer: Medicare HMO | Source: Ambulatory Visit | Attending: Orthopedic Surgery | Admitting: Orthopedic Surgery

## 2021-11-19 VITALS — BP 153/80 | HR 61 | Temp 98.8°F | Resp 18 | Ht 68.0 in | Wt 129.0 lb

## 2021-11-19 DIAGNOSIS — Z8673 Personal history of transient ischemic attack (TIA), and cerebral infarction without residual deficits: Secondary | ICD-10-CM | POA: Insufficient documentation

## 2021-11-19 DIAGNOSIS — Z20822 Contact with and (suspected) exposure to covid-19: Secondary | ICD-10-CM | POA: Diagnosis not present

## 2021-11-19 DIAGNOSIS — M1611 Unilateral primary osteoarthritis, right hip: Secondary | ICD-10-CM | POA: Insufficient documentation

## 2021-11-19 DIAGNOSIS — Z01818 Encounter for other preprocedural examination: Secondary | ICD-10-CM | POA: Diagnosis not present

## 2021-11-19 DIAGNOSIS — F17210 Nicotine dependence, cigarettes, uncomplicated: Secondary | ICD-10-CM | POA: Insufficient documentation

## 2021-11-19 DIAGNOSIS — R531 Weakness: Secondary | ICD-10-CM | POA: Insufficient documentation

## 2021-11-19 DIAGNOSIS — I10 Essential (primary) hypertension: Secondary | ICD-10-CM | POA: Insufficient documentation

## 2021-11-19 DIAGNOSIS — Z7902 Long term (current) use of antithrombotics/antiplatelets: Secondary | ICD-10-CM | POA: Insufficient documentation

## 2021-11-19 LAB — BASIC METABOLIC PANEL
Anion gap: 5 (ref 5–15)
BUN: 12 mg/dL (ref 8–23)
CO2: 30 mmol/L (ref 22–32)
Calcium: 9 mg/dL (ref 8.9–10.3)
Chloride: 104 mmol/L (ref 98–111)
Creatinine, Ser: 0.63 mg/dL (ref 0.61–1.24)
GFR, Estimated: >60 mL/min (ref >60)
Glucose, Bld: 93 mg/dL (ref 70–99)
Potassium: 4.1 mmol/L (ref 3.5–5.1)
Sodium: 139 mmol/L (ref 135–145)

## 2021-11-19 LAB — TYPE AND SCREEN
ABO/RH(D): O POS
Antibody Screen: NEGATIVE

## 2021-11-19 LAB — CBC
HCT: 38.1 % — ABNORMAL LOW (ref 39.0–52.0)
Hemoglobin: 12.4 g/dL — ABNORMAL LOW (ref 13.0–17.0)
MCH: 32.6 pg (ref 26.0–34.0)
MCHC: 32.5 g/dL (ref 30.0–36.0)
MCV: 100.3 fL — ABNORMAL HIGH (ref 80.0–100.0)
Platelets: 247 10*3/uL (ref 150–400)
RBC: 3.8 MIL/uL — ABNORMAL LOW (ref 4.22–5.81)
RDW: 13.3 % (ref 11.5–15.5)
WBC: 4.7 10*3/uL (ref 4.0–10.5)
nRBC: 0 % (ref 0.0–0.2)

## 2021-11-19 LAB — SURGICAL PCR SCREEN
MRSA, PCR: NEGATIVE
Staphylococcus aureus: NEGATIVE

## 2021-11-19 NOTE — Progress Notes (Addendum)
COVID test-11/28/21 at 9:45 ? ? ?Bowel prep reminder:NA ? ?PCP - Dr. Jeanne Ivan ?Cardiologist - none ? ?Chest x-ray - no ?EKG - 11/19/21-chart ?Stress Test - no ?ECHO - 07/26/20-epic ?Cardiac Cath - 2016-epic ?Pacemaker/ICD device last checked:NA ? ?Sleep Study - no ?CPAP -  ? ?Fasting Blood Sugar - NA ?Checks Blood Sugar _____ times a day ? ?Blood Thinner Instructions:Plavix/ Dr. Doylene Canard ?Aspirin Instructions:Stop 5 days prior to DOS/ Dr. [name] ?Last Dose:11/26/21 ? ?Anesthesia review: yes ? ?Patient denies shortness of breath, fever, cough and chest pain at PAT appointment ?Pt is a smoker. He had a stroke Jan 2023. He is very HOH and can't see much. ?He denies SOB. I had a hard time explaining things to him. ?Patient verbalized understanding of instructions that were given to them at the PAT appointment. Patient was also instructed that they will need to review over the PAT instructions again at home before surgery. Pt's Granddaughter dropped him off and went out to eat. I read the instructions to her over the phone while she was eating and told her to review them with him and make sure his daughter sees them. ?

## 2021-11-20 NOTE — Progress Notes (Signed)
Anesthesia Chart Review ? ? Case: [id number] Date/Time: 12/02/21 0715  ? Procedure: TOTAL HIP ARTHROPLASTY (Right: Hip)  ? Anesthesia type: Choice  ? Pre-op diagnosis: RIGHT HIP DEGENERATIVE JOINT DISEASE  ? Location: [facility] ROOM 07 / [facility]  ? Surgeons: Marchia Bond, MD  ? ?  ? ? ?DISCUSSION:81 y.o. smoker with h/o HTN, Stroke, right hip djd scheduled for above procedure 12/02/2021 with Dr. Marchia Bond.  ? ?TIA in 2016 with residual left arm weakness. Incidental findings of lacunar infarct during hospitalization 07/25/2020. On Plavix.  Last seen by neurology 10/06/2021. Per OV note overall stable from stroke standpoint and can follow up with neuro as needed.  ? ?Cleared for surgery by neurology. Pt was advised to hold Plavix 5 days prior to surgery. Reviewed with pt by PAT nurse.  ? ?Anticipate pt can proceed with planned procedure barring acute status change.   ?VS: BP (!) 153/80   Pulse 61   Temp 37.1 ?C (Oral)   Resp 18   Ht 5\' 8"  (1.727 m)   Wt 58.5 kg   SpO2 100%   BMI 19.61 kg/m?  ? ?PROVIDERS: ?Dixie Dials, MD is PCP  ? ? ?LABS: Labs reviewed: Acceptable for surgery. ?(all labs ordered are listed, but only abnormal results are displayed) ? ?Labs Reviewed  ?CBC - Abnormal; Notable for the following components:  ?    Result Value  ? RBC 3.80 (*)   ? Hemoglobin 12.4 (*)   ? HCT 38.1 (*)   ? MCV 100.3 (*)   ? All other components within normal limits  ?SURGICAL PCR SCREEN  ?BASIC METABOLIC PANEL  ?TYPE AND SCREEN  ? ? ? ?IMAGES: ? ? ?EKG: ?11/19/2021 ?Rate 62 bpm  ?NSR ?Anteroseptal infarct, age undetermined ?Abnormal ECG ?No significant change since last tracing ? ?CV: ?Echo 07/26/20 ?1. Left ventricular ejection fraction, by estimation, is 65 to 70%. The  ?left ventricle has normal function. The left ventricle has no regional  ?wall motion abnormalities. Left ventricular diastolic parameters are  ?consistent with Grade I diastolic  ?dysfunction (impaired relaxation).  ? 2. Right ventricular systolic  function is normal. The right ventricular  ?size is normal.  ? 3. Left atrial size was mildly dilated.  ? 4. The mitral valve is degenerative. Mild mitral valve regurgitation.  ? 5. The aortic valve is calcified. There is severe calcifcation of the  ?aortic valve. There is mild thickening of the aortic valve. Aortic valve  ?regurgitation is not visualized. Mild aortic valve sclerosis is present,  ?with no evidence of aortic valve  ?stenosis.  ? 6. There is mild (Grade II) atheroma plaque involving the aortic root and  ?ascending aorta.  ? 7. The inferior vena cava is normal in size with greater than 50%  ?respiratory variability, suggesting right atrial pressure of 3 mmHg.  ?Past Medical History:  ?Diagnosis Date  ? Hyperlipidemia   ? Hypertension   ? Stroke [facility]) 09/2021  ? ? ?Past Surgical History:  ?Procedure Laterality Date  ? CARDIAC CATHETERIZATION N/A 06/07/2015  ? Procedure: Left Heart Cath and Coronary Angiography;  Surgeon: Dixie Dials, MD;  Location: [place] [facility];  Service: Cardiovascular;  Laterality: N/A;  ? ENUCLEATION Right   ? RUPTURED GLOBE EXPLORATION AND REPAIR Left   ? ? ?MEDICATIONS: ? acetaminophen (TYLENOL) 325 MG tablet  ? albuterol (VENTOLIN HFA) 108 (90 Base) MCG/ACT inhaler  ? amLODipine (NORVASC) 2.5 MG tablet  ? aspirin EC 81 MG EC tablet  ? atorvastatin (LIPITOR) 40  MG tablet  ? clopidogrel (PLAVIX) 75 MG tablet  ? lisinopril (ZESTRIL) 10 MG tablet  ? potassium chloride (KLOR-CON) 10 MEQ tablet  ? timolol (TIMOPTIC-XR) 0.5 % ophthalmic gel-forming  ? ?No current facility-administered medications for this encounter.  ? ? ? ?Konrad Felix, PA-C ?[facility] Pre-Surgical Testing ?[phone] ?@TODAY @ 12:29 PM   ? ? ? ? ?

## 2021-11-20 NOTE — Anesthesia Preprocedure Evaluation (Signed)
Anesthesia Evaluation    Airway        Dental   Pulmonary Current Smoker,           Cardiovascular hypertension,      Neuro/Psych    GI/Hepatic   Endo/Other    Renal/GU      Musculoskeletal   Abdominal   Peds  Hematology   Anesthesia Other Findings   Reproductive/Obstetrics                             Anesthesia Physical Anesthesia Plan  ASA:   Anesthesia Plan:    Post-op Pain Management:    Induction:   PONV Risk Score and Plan:   Airway Management Planned:   Additional Equipment:   Intra-op Plan:   Post-operative Plan:   Informed Consent:   Plan Discussed with:   Anesthesia Plan Comments: (See PAT note 11/19/2021, Konrad Felix [place], PA-C)        Anesthesia Quick Evaluation

## 2021-11-25 NOTE — Care Plan (Signed)
Ortho Bundle Case Management Note ? ?Patient Details  ?Name: Terry Knox ?MRN: [id number] ?Date of Birth: Feb 12, 1941 ? ? Met with patient in the office prior to surgery. He will discharge to home with his family to assist. Rolling walker ordered for home use. HHPT referral to [place] care and OPPT set up with [facility].  ?Patient and MD in agreement with plan. Choice offered.  ?Please note- patient is legally blind and extremely hard of hearing. He refers most conversations to his daughters.                ? ? ? ?DME Arranged:  Walker rolling ?DME Agency:  [employer] ? ?HH Arranged:  PT ?[place] Agency:  [facility] ? ?Additional Comments: ?Please contact me with any questions of if this plan should need to change. ? ?[name]  [facility] Orthopaedic Specialist  [phone] ?11/25/2021, 12:20 PM ?  ?

## 2021-11-28 ENCOUNTER — Encounter (HOSPITAL_COMMUNITY): Payer: Medicare HMO

## 2021-12-02 ENCOUNTER — Ambulatory Visit (HOSPITAL_COMMUNITY): Payer: Medicare HMO

## 2021-12-02 ENCOUNTER — Ambulatory Visit (HOSPITAL_BASED_OUTPATIENT_CLINIC_OR_DEPARTMENT_OTHER): Payer: Medicare HMO | Admitting: Certified Registered"

## 2021-12-02 ENCOUNTER — Other Ambulatory Visit: Payer: Self-pay

## 2021-12-02 ENCOUNTER — Observation Stay (HOSPITAL_COMMUNITY)
Admission: RE | Admit: 2021-12-02 | Discharge: 2021-12-03 | Disposition: A | Discharge status: Home Health | Payer: Medicare HMO | Source: Ambulatory Visit | Attending: Orthopedic Surgery | Admitting: Orthopedic Surgery

## 2021-12-02 ENCOUNTER — Encounter (HOSPITAL_COMMUNITY): Payer: Self-pay | Admitting: Orthopedic Surgery

## 2021-12-02 ENCOUNTER — Encounter (HOSPITAL_COMMUNITY)
Admission: RE | Disposition: A | Discharge status: Home Health | Payer: Self-pay | Source: Ambulatory Visit | Attending: Orthopedic Surgery

## 2021-12-02 ENCOUNTER — Ambulatory Visit (HOSPITAL_COMMUNITY): Payer: Medicare HMO | Admitting: Physician Assistant

## 2021-12-02 DIAGNOSIS — Z7982 Long term (current) use of aspirin: Secondary | ICD-10-CM | POA: Diagnosis not present

## 2021-12-02 DIAGNOSIS — Z7902 Long term (current) use of antithrombotics/antiplatelets: Secondary | ICD-10-CM | POA: Diagnosis not present

## 2021-12-02 DIAGNOSIS — J449 Chronic obstructive pulmonary disease, unspecified: Secondary | ICD-10-CM

## 2021-12-02 DIAGNOSIS — I69354 Hemiplegia and hemiparesis following cerebral infarction affecting left non-dominant side: Secondary | ICD-10-CM | POA: Diagnosis not present

## 2021-12-02 DIAGNOSIS — M1611 Unilateral primary osteoarthritis, right hip: Principal | ICD-10-CM | POA: Diagnosis present

## 2021-12-02 DIAGNOSIS — I10 Essential (primary) hypertension: Secondary | ICD-10-CM | POA: Insufficient documentation

## 2021-12-02 DIAGNOSIS — I69331 Monoplegia of upper limb following cerebral infarction affecting right dominant side: Secondary | ICD-10-CM | POA: Diagnosis not present

## 2021-12-02 DIAGNOSIS — Z79899 Other long term (current) drug therapy: Secondary | ICD-10-CM | POA: Insufficient documentation

## 2021-12-02 DIAGNOSIS — F17210 Nicotine dependence, cigarettes, uncomplicated: Secondary | ICD-10-CM | POA: Diagnosis not present

## 2021-12-02 DIAGNOSIS — Z471 Aftercare following joint replacement surgery: Secondary | ICD-10-CM | POA: Diagnosis not present

## 2021-12-02 DIAGNOSIS — Z96641 Presence of right artificial hip joint: Secondary | ICD-10-CM | POA: Diagnosis not present

## 2021-12-02 HISTORY — PX: TOTAL HIP ARTHROPLASTY: SHX124

## 2021-12-02 LAB — ABO/RH: ABO/RH(D): O POS

## 2021-12-02 SURGERY — ARTHROPLASTY, HIP, TOTAL,POSTERIOR APPROACH
Anesthesia: General | Site: Hip | Laterality: Right

## 2021-12-02 MED ORDER — STERILE WATER FOR IRRIGATION IR SOLN
Status: DC | PRN
  Administered 2021-12-02: 2000 mL

## 2021-12-02 MED ORDER — MORPHINE SULFATE (PF) 2 MG/ML IV SOLN: 0.5000 mg | INTRAVENOUS | Status: DC | PRN

## 2021-12-02 MED ORDER — BUPIVACAINE IN DEXTROSE 0.75-8.25 % IT SOLN
INTRATHECAL | Status: DC | PRN
  Administered 2021-12-02: 15 mg via INTRATHECAL

## 2021-12-02 MED ORDER — POTASSIUM CHLORIDE IN NACL 20-0.9 MEQ/L-% IV SOLN
INTRAVENOUS | Status: DC
  Filled 2021-12-02 (×2): qty 1000

## 2021-12-02 MED ORDER — ATORVASTATIN CALCIUM 40 MG PO TABS: 40.0000 mg | ORAL_TABLET | Freq: Every day | ORAL | Status: DC

## 2021-12-02 MED ORDER — POVIDONE-IODINE 10 % EX SWAB: 2.0000 "application " | Freq: Once | CUTANEOUS | Status: DC

## 2021-12-02 MED ORDER — METOCLOPRAMIDE HCL 5 MG/ML IJ SOLN: 5.0000 mg | Freq: Three times a day (TID) | INTRAMUSCULAR | Status: DC | PRN

## 2021-12-02 MED ORDER — METOCLOPRAMIDE HCL 5 MG PO TABS: 5.0000 mg | ORAL_TABLET | Freq: Three times a day (TID) | ORAL | Status: DC | PRN

## 2021-12-02 MED ORDER — HYDROCODONE-ACETAMINOPHEN 7.5-325 MG PO TABS
1.0000 | ORAL_TABLET | ORAL | Status: DC | PRN
  Administered 2021-12-03: 1 via ORAL
  Filled 2021-12-02: qty 1

## 2021-12-02 MED ORDER — PHENYLEPHRINE HCL (PRESSORS) 10 MG/ML IV SOLN
INTRAVENOUS | Status: AC
  Filled 2021-12-02: qty 1

## 2021-12-02 MED ORDER — ASPIRIN EC 325 MG PO TBEC
325.0000 mg | DELAYED_RELEASE_TABLET | Freq: Every day | ORAL | Status: DC
  Administered 2021-12-03: 325 mg via ORAL
  Filled 2021-12-02: qty 1

## 2021-12-02 MED ORDER — KETOROLAC TROMETHAMINE 30 MG/ML IJ SOLN
INTRAMUSCULAR | Status: AC
  Filled 2021-12-02: qty 1

## 2021-12-02 MED ORDER — ACETAMINOPHEN 500 MG PO TABS
500.0000 mg | ORAL_TABLET | Freq: Four times a day (QID) | ORAL | Status: DC
  Administered 2021-12-03: 500 mg via ORAL
  Filled 2021-12-02: qty 1

## 2021-12-02 MED ORDER — LACTATED RINGERS IV SOLN: INTRAVENOUS | Status: DC

## 2021-12-02 MED ORDER — PHENOL 1.4 % MT LIQD: 1.0000 | OROMUCOSAL | Status: DC | PRN

## 2021-12-02 MED ORDER — ONDANSETRON HCL 4 MG/2ML IJ SOLN
INTRAMUSCULAR | Status: AC
  Filled 2021-12-02: qty 2

## 2021-12-02 MED ORDER — LISINOPRIL 10 MG PO TABS
10.0000 mg | ORAL_TABLET | Freq: Every day | ORAL | Status: DC
  Administered 2021-12-03: 10 mg via ORAL
  Filled 2021-12-02: qty 1

## 2021-12-02 MED ORDER — TRANEXAMIC ACID-NACL 1000-0.7 MG/100ML-% IV SOLN
1000.0000 mg | INTRAVENOUS | Status: AC
  Administered 2021-12-02: 1000 mg via INTRAVENOUS
  Filled 2021-12-02: qty 100

## 2021-12-02 MED ORDER — DEXAMETHASONE SODIUM PHOSPHATE 10 MG/ML IJ SOLN
INTRAMUSCULAR | Status: DC | PRN
  Administered 2021-12-02: 10 mg via INTRAVENOUS

## 2021-12-02 MED ORDER — FENTANYL CITRATE (PF) 250 MCG/5ML IJ SOLN
INTRAMUSCULAR | Status: DC | PRN
  Administered 2021-12-02 (×8): 25 ug via INTRAVENOUS

## 2021-12-02 MED ORDER — FENTANYL CITRATE PF 50 MCG/ML IJ SOSY: 25.0000 ug | PREFILLED_SYRINGE | INTRAMUSCULAR | Status: DC | PRN

## 2021-12-02 MED ORDER — DOCUSATE SODIUM 100 MG PO CAPS
100.0000 mg | ORAL_CAPSULE | Freq: Two times a day (BID) | ORAL | Status: DC
  Administered 2021-12-02 – 2021-12-03 (×2): 100 mg via ORAL
  Filled 2021-12-02 (×2): qty 1

## 2021-12-02 MED ORDER — 0.9 % SODIUM CHLORIDE (POUR BTL) OPTIME
TOPICAL | Status: DC | PRN
  Administered 2021-12-02: 1000 mL

## 2021-12-02 MED ORDER — PHENYLEPHRINE HCL-NACL 20-0.9 MG/250ML-% IV SOLN
INTRAVENOUS | Status: DC | PRN
  Administered 2021-12-02: 50 ug/min via INTRAVENOUS

## 2021-12-02 MED ORDER — BUPIVACAINE HCL 0.25 % IJ SOLN
INTRAMUSCULAR | Status: AC
  Filled 2021-12-02: qty 1

## 2021-12-02 MED ORDER — AMLODIPINE BESYLATE 5 MG PO TABS
2.5000 mg | ORAL_TABLET | Freq: Every day | ORAL | Status: DC
  Administered 2021-12-03: 2.5 mg via ORAL
  Filled 2021-12-02: qty 1

## 2021-12-02 MED ORDER — HYDROCODONE-ACETAMINOPHEN 5-325 MG PO TABS
1.0000 | ORAL_TABLET | ORAL | Status: DC | PRN
  Administered 2021-12-02: 1 via ORAL
  Administered 2021-12-03 (×2): 2 via ORAL
  Filled 2021-12-02 (×2): qty 2
  Filled 2021-12-02: qty 1

## 2021-12-02 MED ORDER — CHLORHEXIDINE GLUCONATE 0.12 % MT SOLN
15.0000 mL | Freq: Once | OROMUCOSAL | Status: AC
  Administered 2021-12-02: 15 mL via OROMUCOSAL

## 2021-12-02 MED ORDER — TIMOLOL MALEATE 0.5 % OP SOLN
1.0000 [drp] | Freq: Two times a day (BID) | OPHTHALMIC | Status: DC
  Administered 2021-12-02 – 2021-12-03 (×2): 1 [drp] via OPHTHALMIC
  Filled 2021-12-02: qty 5

## 2021-12-02 MED ORDER — TRANEXAMIC ACID-NACL 1000-0.7 MG/100ML-% IV SOLN
1000.0000 mg | Freq: Once | INTRAVENOUS | Status: AC
  Administered 2021-12-02: 1000 mg via INTRAVENOUS
  Filled 2021-12-02: qty 100

## 2021-12-02 MED ORDER — PROPOFOL 10 MG/ML IV BOLUS
INTRAVENOUS | Status: DC | PRN
  Administered 2021-12-02: 40 mg via INTRAVENOUS
  Administered 2021-12-02 (×3): 20 mg via INTRAVENOUS
  Administered 2021-12-02: 100 mg via INTRAVENOUS
  Administered 2021-12-02: 20 mg via INTRAVENOUS

## 2021-12-02 MED ORDER — FENTANYL CITRATE (PF) 100 MCG/2ML IJ SOLN
INTRAMUSCULAR | Status: AC
  Filled 2021-12-02: qty 2

## 2021-12-02 MED ORDER — FLEET ENEMA 7-19 GM/118ML RE ENEM: 1.0000 | ENEMA | Freq: Once | RECTAL | Status: DC | PRN

## 2021-12-02 MED ORDER — DEXAMETHASONE SODIUM PHOSPHATE 10 MG/ML IJ SOLN
10.0000 mg | Freq: Once | INTRAMUSCULAR | Status: AC
  Administered 2021-12-03: 10 mg via INTRAVENOUS
  Filled 2021-12-02: qty 1

## 2021-12-02 MED ORDER — KETOROLAC TROMETHAMINE 30 MG/ML IJ SOLN
INTRAMUSCULAR | Status: DC | PRN
Start: 2021-12-02 — End: 2021-12-02
  Administered 2021-12-02: 30 mg via INTRAMUSCULAR

## 2021-12-02 MED ORDER — ORAL CARE MOUTH RINSE
15.0000 mL | Freq: Once | OROMUCOSAL | Status: AC
Start: 2021-12-02 — End: 2021-12-02

## 2021-12-02 MED ORDER — BUPIVACAINE HCL (PF) 0.25 % IJ SOLN
INTRAMUSCULAR | Status: DC | PRN
Start: 2021-12-02 — End: 2021-12-02
  Administered 2021-12-02: 30 mL

## 2021-12-02 MED ORDER — PHENYLEPHRINE 40 MCG/ML (10ML) SYRINGE FOR IV PUSH (FOR BLOOD PRESSURE SUPPORT)
PREFILLED_SYRINGE | INTRAVENOUS | Status: AC
  Filled 2021-12-02: qty 20

## 2021-12-02 MED ORDER — ALUM & MAG HYDROXIDE-SIMETH 200-200-20 MG/5ML PO SUSP: 30.0000 mL | ORAL | Status: DC | PRN

## 2021-12-02 MED ORDER — ONDANSETRON HCL 4 MG/2ML IJ SOLN
INTRAMUSCULAR | Status: DC | PRN
  Administered 2021-12-02: 4 mg via INTRAVENOUS

## 2021-12-02 MED ORDER — ONDANSETRON HCL 4 MG PO TABS: 4.0000 mg | ORAL_TABLET | Freq: Four times a day (QID) | ORAL | Status: DC | PRN

## 2021-12-02 MED ORDER — ALBUTEROL SULFATE (2.5 MG/3ML) 0.083% IN NEBU: 3.0000 mL | INHALATION_SOLUTION | Freq: Four times a day (QID) | RESPIRATORY_TRACT | Status: DC | PRN

## 2021-12-02 MED ORDER — DEXAMETHASONE SODIUM PHOSPHATE 10 MG/ML IJ SOLN
INTRAMUSCULAR | Status: AC
  Filled 2021-12-02: qty 1

## 2021-12-02 MED ORDER — ACETAMINOPHEN 500 MG PO TABS: 1000.0000 mg | ORAL_TABLET | Freq: Once | ORAL | Status: DC

## 2021-12-02 MED ORDER — CEFAZOLIN SODIUM-DEXTROSE 1-4 GM/50ML-% IV SOLN
1.0000 g | Freq: Four times a day (QID) | INTRAVENOUS | Status: AC
  Administered 2021-12-02 – 2021-12-03 (×2): 1 g via INTRAVENOUS
  Filled 2021-12-02 (×2): qty 50

## 2021-12-02 MED ORDER — EPHEDRINE 5 MG/ML INJ
INTRAVENOUS | Status: AC
  Filled 2021-12-02: qty 25

## 2021-12-02 MED ORDER — EPHEDRINE SULFATE-NACL 50-0.9 MG/10ML-% IV SOSY
PREFILLED_SYRINGE | INTRAVENOUS | Status: DC | PRN
Start: 2021-12-02 — End: 2021-12-02
  Administered 2021-12-02 (×5): 5 mg via INTRAVENOUS

## 2021-12-02 MED ORDER — PROPOFOL 500 MG/50ML IV EMUL
INTRAVENOUS | Status: DC | PRN
  Administered 2021-12-02: 50 ug/kg/min via INTRAVENOUS

## 2021-12-02 MED ORDER — CLOPIDOGREL BISULFATE 75 MG PO TABS
75.0000 mg | ORAL_TABLET | Freq: Every day | ORAL | Status: DC
  Administered 2021-12-03: 75 mg via ORAL
  Filled 2021-12-02: qty 1

## 2021-12-02 MED ORDER — CEFAZOLIN SODIUM-DEXTROSE 2-4 GM/100ML-% IV SOLN
2.0000 g | INTRAVENOUS | Status: AC
  Administered 2021-12-02: 2 g via INTRAVENOUS
  Filled 2021-12-02: qty 100

## 2021-12-02 MED ORDER — DIPHENHYDRAMINE HCL 12.5 MG/5ML PO ELIX: 12.5000 mg | ORAL_SOLUTION | ORAL | Status: DC | PRN

## 2021-12-02 MED ORDER — POVIDONE-IODINE 7.5 % EX SOLN: Freq: Once | CUTANEOUS | Status: AC

## 2021-12-02 MED ORDER — ACETAMINOPHEN 325 MG PO TABS: 325.0000 mg | ORAL_TABLET | Freq: Four times a day (QID) | ORAL | Status: DC | PRN

## 2021-12-02 MED ORDER — ONDANSETRON HCL 4 MG/2ML IJ SOLN
4.0000 mg | Freq: Four times a day (QID) | INTRAMUSCULAR | Status: DC | PRN
Start: 2021-12-02 — End: 2021-12-03

## 2021-12-02 MED ORDER — MENTHOL 3 MG MT LOZG
1.0000 | LOZENGE | OROMUCOSAL | Status: DC | PRN
  Administered 2021-12-03: 3 mg via ORAL
  Filled 2021-12-02: qty 9

## 2021-12-02 MED ORDER — POLYETHYLENE GLYCOL 3350 17 G PO PACK: 17.0000 g | PACK | Freq: Every day | ORAL | Status: DC | PRN

## 2021-12-02 MED ORDER — BISACODYL 10 MG RE SUPP: 10.0000 mg | Freq: Every day | RECTAL | Status: DC | PRN

## 2021-12-02 SURGICAL SUPPLY — 59 items
BAG COUNTER SPONGE SURGICOUNT (BAG) IMPLANT
BIT DRILL 2.0X128 (BIT) ×2 IMPLANT
BLADE SAW SAG 73X25 THK (BLADE) ×1
BLADE SAW SGTL 73X25 THK (BLADE) ×1 IMPLANT
CLSR STERI-STRIP ANTIMIC 1/2X4 (GAUZE/BANDAGES/DRESSINGS) ×3 IMPLANT
COVER SURGICAL LIGHT HANDLE (MISCELLANEOUS) ×2 IMPLANT
DRAPE INCISE IOBAN 66X45 STRL (DRAPES) ×2 IMPLANT
DRAPE ORTHO SPLIT 77X108 STRL (DRAPES) ×2
DRAPE POUCH INSTRU U-SHP 10X18 (DRAPES) ×2 IMPLANT
DRAPE SHEET LG 3/4 BI-LAMINATE (DRAPES) ×2 IMPLANT
DRAPE SURG 17X11 SM STRL (DRAPES) ×2 IMPLANT
DRAPE SURG ORHT 6 SPLT 77X108 (DRAPES) ×2 IMPLANT
DRAPE U-SHAPE 47X51 STRL (DRAPES) ×2 IMPLANT
DRSG MEPILEX BORDER 4X8 (GAUZE/BANDAGES/DRESSINGS) ×2 IMPLANT
DURAPREP 26ML APPLICATOR (WOUND CARE) ×4 IMPLANT
ELECT BLADE TIP CTD 4 INCH (ELECTRODE) ×2 IMPLANT
ELECT REM PT RETURN 15FT ADLT (MISCELLANEOUS) ×2 IMPLANT
ELIMINATOR HOLE APEX DEPUY (Hips) ×1 IMPLANT
FACESHIELD WRAPAROUND (MASK) ×2 IMPLANT
FACESHIELD WRAPAROUND OR TEAM (MASK) ×1 IMPLANT
GLOVE SRG 8 PF TXTR STRL LF DI (GLOVE) ×1 IMPLANT
GLOVE SURG ENC MOIS LTX SZ7.5 (GLOVE) ×2 IMPLANT
GLOVE SURG POLYISO LF SZ6.5 (GLOVE) ×2 IMPLANT
GLOVE SURG UNDER POLY LF SZ7 (GLOVE) ×2 IMPLANT
GLOVE SURG UNDER POLY LF SZ8 (GLOVE) ×1
GOWN STRL REUS W/ TWL LRG LVL3 (GOWN DISPOSABLE) ×2 IMPLANT
GOWN STRL REUS W/TWL LRG LVL3 (GOWN DISPOSABLE) ×2
HEAD M SROM 36MM PLUS 1.5 (Hips) IMPLANT
HOOD PEEL AWAY FLYTE STAYCOOL (MISCELLANEOUS) ×6 IMPLANT
KIT BASIN OR (CUSTOM PROCEDURE TRAY) ×2 IMPLANT
KIT TURNOVER KIT A (KITS) IMPLANT
LINER NEUTRAL 52X36MM PLUS 4 (Liner) ×1 IMPLANT
MANIFOLD NEPTUNE II (INSTRUMENTS) ×2 IMPLANT
NDL MA TROC 1/2 (NEEDLE) IMPLANT
NDL SAFETY ECLIPSE 18X1.5 (NEEDLE) ×2 IMPLANT
NEEDLE HYPO 18GX1.5 SHARP (NEEDLE) ×2
NEEDLE MA TROC 1/2 (NEEDLE) IMPLANT
NS IRRIG 1000ML POUR BTL (IV SOLUTION) ×2 IMPLANT
PACK TOTAL JOINT (CUSTOM PROCEDURE TRAY) ×2 IMPLANT
PIN SECTOR W/GRIP ACE CUP 52MM (Hips) ×1 IMPLANT
PROTECTOR NERVE ULNAR (MISCELLANEOUS) ×2 IMPLANT
RETRIEVER SUT HEWSON (MISCELLANEOUS) ×2 IMPLANT
SCREW 6.5MMX25MM (Screw) ×1 IMPLANT
SROM M HEAD 36MM PLUS 1.5 (Hips) ×2 IMPLANT
STEM HIP W/DUOFIX (Stem) ×1 IMPLANT
SUCTION FRAZIER HANDLE 12FR (TUBING) ×1
SUCTION TUBE FRAZIER 12FR DISP (TUBING) ×1 IMPLANT
SUT FIBERWIRE #2 38 REV NDL BL (SUTURE) ×6
SUT VIC AB 0 CT1 36 (SUTURE) ×2 IMPLANT
SUT VIC AB 1 CT1 36 (SUTURE) ×4 IMPLANT
SUT VIC AB 2-0 CT1 27 (SUTURE) ×2
SUT VIC AB 2-0 CT1 TAPERPNT 27 (SUTURE) ×2 IMPLANT
SUT VIC AB 3-0 SH 8-18 (SUTURE) ×2 IMPLANT
SUTURE FIBERWR#2 38 REV NDL BL (SUTURE) ×3 IMPLANT
SYR CONTROL 10ML LL (SYRINGE) ×4 IMPLANT
TOWEL OR 17X26 10 PK STRL BLUE (TOWEL DISPOSABLE) ×2 IMPLANT
TRAY FOLEY MTR SLVR 16FR STAT (SET/KITS/TRAYS/PACK) ×2 IMPLANT
TUBE SUCTION HIGH CAP CLEAR NV (SUCTIONS) ×2 IMPLANT
WATER STERILE IRR 1000ML POUR (IV SOLUTION) ×4 IMPLANT

## 2021-12-02 NOTE — H&P (Signed)
HIP ARTHROPLASTY ADMISSION H&P ? ?Patient ID: ?Terry Knox ?MRN: [id number] ?DOB/AGE: 1941-07-28 81 y.o. ? ?Chief Complaint: right hip pain. ? ?Planned Procedure Date: 12/02/21 ?Medical Clearance by Dr. Doylene Canard   ?Additional clearance by Frann Rider, NP (neuro) ? ?HPI: ?Terry Knox is a 81 y.o. male who presents for evaluation of RIGHT HIP DEGENERATIVE JOINT DISEASE. The patient has a history of pain and functional disability in the right hip due to arthritis and has failed non-surgical conservative treatments for greater than 12 weeks to include NSAID's and/or analgesics, corticosteriod injections, and activity modification.  Onset of symptoms was gradual, starting 2 years ago with rapidlly worsening course since that time. The patient noted no past surgery on the right hip.  Patient currently rates pain at 10 out of 10 with activity. Patient has worsening of pain with activity and weight bearing and pain that interferes with activities of daily living.  Patient has evidence of joint space narrowing by imaging studies.  There is no active infection. ? ?Past Medical History:  ?Diagnosis Date  ? Hyperlipidemia   ? Hypertension   ? Stroke [facility]) 09/2021  ? ?Past Surgical History:  ?Procedure Laterality Date  ? CARDIAC CATHETERIZATION N/A 06/07/2015  ? Procedure: Left Heart Cath and Coronary Angiography;  Surgeon: Dixie Dials, MD;  Location: [place] [facility];  Service: Cardiovascular;  Laterality: N/A;  ? ENUCLEATION Right   ? RUPTURED GLOBE EXPLORATION AND REPAIR Left   ? ?No Known Allergies ?Prior to Admission medications   ?Medication Sig Start Date End Date Taking? Authorizing Provider  ?acetaminophen (TYLENOL) 325 MG tablet Take 650 mg by mouth every 6 (six) hours as needed.   Yes [provider]  ?albuterol (VENTOLIN HFA) 108 (90 Base) MCG/ACT inhaler Inhale into the lungs. 07/18/20  Yes [provider]  ?amLODipine (NORVASC) 2.5 MG tablet Take 1 tablet (2.5 mg total) by mouth daily. 07/31/20  Yes  Dixie Dials, MD  ?aspirin EC 81 MG EC tablet Take 1 tablet (81 mg total) by mouth daily. 03/16/15  Yes Hillary Bow, MD  ?atorvastatin (LIPITOR) 40 MG tablet Take 40 mg by mouth at bedtime. 05/16/20  Yes [provider]  ?clopidogrel (PLAVIX) 75 MG tablet Take 75 mg by mouth daily.   Yes [provider]  ?lisinopril (ZESTRIL) 10 MG tablet Take 10 mg by mouth daily. 07/04/20  Yes [provider]  ?potassium chloride (KLOR-CON) 10 MEQ tablet Take 10 mEq by mouth daily. 09/17/21  Yes [provider]  ?timolol (TIMOPTIC-XR) 0.5 % ophthalmic gel-forming Place 1 drop into the left eye 2 (two) times daily. 04/05/15  Yes [provider]  ? ?Social History  ? ?Socioeconomic History  ? Marital status: Divorced  ?  Spouse name: Not on file  ? Number of children: Not on file  ? Years of education: Not on file  ? Highest education level: Not on file  ?Occupational History  ? Not on file  ?Tobacco Use  ? Smoking status: Every Day  ?  Packs/day: 1.00  ?  Years: 60.00  ?  Pack years: 60.00  ?  Types: Cigarettes  ? Smokeless tobacco: Never  ?Vaping Use  ? Vaping Use: Never used  ?Substance and Sexual Activity  ? Alcohol use: No  ?  Alcohol/week: 0.0 standard drinks  ? Drug use: No  ? Sexual activity: Not on file  ?Other Topics Concern  ? Not on file  ?Social History Narrative  ? Not on file  ? ?Social  Determinants of Health  ? ?Financial Resource Strain: Not on file  ?Food Insecurity: Not on file  ?Transportation Needs: Not on file  ?Physical Activity: Not on file  ?Stress: Not on file  ?Social Connections: Not on file  ? ?Family History  ?Problem Relation Age of Onset  ? Hypertension Mother   ? ? ?ROS: Currently denies lightheadedness, dizziness, Fever, chills, CP, SOB.   ?No personal history of DVT, PE, MI. Hx of CVA, on plavix. ?He has full dentures. ?All other systems have been reviewed and were otherwise currently negative with the exception of those mentioned in the HPI and as  above. ? ?Objective: ?Vitals: Ht: 5' 8: Wt: 124.4 lbs Temp: 97.6 BP: 167/84 Pulse: 65 O2 97% on room air.   ?Physical Exam: ?General: Alert, NAD. Trendelenberg Gait  ?HEENT: EOMI, Good Neck Extension  ?Pulm: No increased work of breathing.  Clear B/L A/P w/o crackle or wheeze.  ?CV: RRR, No m/g/r appreciated  ?GI: soft, NT, ND ?Neuro: Neuro without gross focal deficit.  Sensation intact distally ?Skin: No lesions in the area of chief complaint ?MSK/Surgical Site: right Hip pain with active and passive ROM.  0-90 deg FF at right hip. 30 external rotation, 10 deg internal rotation.  5/5 strength.  NVI.   ? ?Imaging Review ?Plain radiographs demonstrate severe degenerative joint disease of the right hip.  ? ?The bone quality appears to be adequate for age and reported activity level. ? ?Preoperative templating of the joint replacement has been completed, documented, and submitted to the Operating Room personnel in order to optimize intra-operative equipment management. ? ?Assessment: ?RIGHT HIP DEGENERATIVE JOINT DISEASE ?Principal Problem: ?  Osteoarthritis of right hip ? ? ?Plan: ?Plan for Procedure(s): ?TOTAL HIP ARTHROPLASTY ? ?The patient history, physical exam, clinical judgement of the provider and imaging are consistent with end stage degenerative joint disease and total joint arthroplasty is deemed medically necessary. The treatment options including medical management, injection therapy, and arthroplasty were discussed at length. The risks and benefits of Procedure(s): ?TOTAL HIP ARTHROPLASTY were presented and reviewed.  ?The risks of nonoperative treatment, versus surgical intervention including but not limited to continued pain, aseptic loosening, stiffness, dislocation/subluxation, infection, bleeding, nerve injury, blood clots, cardiopulmonary complications, morbidity, mortality, among others were discussed. The patient verbalizes understanding and wishes to proceed with the plan.  ?Patient is being  admitted for surgery, pain control, PT, prophylactic antibiotics, VTE prophylaxis, progressive ambulation, ADL's and discharge planning.  ? ?Dental prophylaxis discussed and recommended for 2 years postoperatively. ? ?The patient does meet the criteria for TXA which will be used perioperatively.   ?The patient is planning to be discharged home with HHPT in care of his daughters ? ? ?Ventura Bruns, PA-C ?12/02/2021 ?6:54 AM ?  ?

## 2021-12-02 NOTE — Anesthesia Procedure Notes (Signed)
Spinal ? ?Patient location during procedure: OR ?End time: 12/02/2021 3:27 PM ?Staffing ?Performed: anesthesiologist  ?Anesthesiologist: Annye Asa, MD ?Preanesthetic Checklist ?Completed: patient identified, IV checked, site marked, risks and benefits discussed, surgical consent, monitors and equipment checked, pre-op evaluation and timeout performed ?Spinal Block ?Patient position: sitting ?Prep: DuraPrep and site prepped and draped ?Patient monitoring: continuous pulse ox, blood pressure, cardiac monitor and heart rate ?Approach: midline ?Location: L3-4 ?Injection technique: single-shot ?Needle ?Needle type: Introducer and Pencan  ?Needle gauge: 24 G ?Needle length: 9 cm ?Assessment ?Events: CSF return ?Additional Notes ?Pt identified in Operating room.  Monitors applied. Working IV access confirmed. Sterile prep, drape lumbar spine.  1% lido local L 3,4.  #24ga Pencan into clear CSF L 3,4.  15mg  0.75% Bupivacaine with dextrose injected with asp CSF beginning and end of injection.  Patient asymptomatic, VSS, no heme aspirated, tolerated well.  Jenita Seashore, MD ?  ? ? ? ?

## 2021-12-02 NOTE — Plan of Care (Signed)
Plan of care discussed.  Patient agreeable to use nurse call button if he needs anything.  Set up and eating dinner now.  Denies pain.  See assessment. ?

## 2021-12-02 NOTE — Interval H&P Note (Signed)
History and Physical Interval Note: ? ?12/02/2021 ?2:08 PM ? ?Terry Knox  has presented today for surgery, with the diagnosis of RIGHT HIP DEGENERATIVE JOINT DISEASE.  The various methods of treatment have been discussed with the patient and family. After consideration of risks, benefits and other options for treatment, the patient has consented to  Procedure(s): ?TOTAL HIP ARTHROPLASTY (Right) as a surgical intervention.  The patient's history has been reviewed, patient examined, no change in status, stable for surgery.  I have reviewed the patient's chart and labs.  Questions were answered to the patient's satisfaction.   ? ? ?[name] ? ? ?

## 2021-12-02 NOTE — Op Note (Signed)
12/02/2021 ? ?5:28 PM ? ?PATIENT:  Terry Knox  ? ?MRN: [id number] ? ?PRE-OPERATIVE DIAGNOSIS: Right hip primary localized osteoarthritis ? ?POST-OPERATIVE DIAGNOSIS:  same ? ?PROCEDURE:  Procedure(s): ?TOTAL HIP ARTHROPLASTY ? ?PREOPERATIVE INDICATIONS:   ? ?Terry Knox is an 81 y.o. male who has a diagnosis of right hip primary localized osteoarthritis and elected for surgical management after failing conservative treatment.  The risks benefits and alternatives were discussed with the patient including but not limited to the risks of nonoperative treatment, versus surgical intervention including infection, bleeding, nerve injury, periprosthetic fracture, the need for revision surgery, dislocation, leg length discrepancy, blood clots, cardiopulmonary complications, morbidity, mortality, among others, and they were willing to proceed.   ? ? ?OPERATIVE REPORT  ?   ?SURGEON:  Marchia Bond, MD ?   ?ASSISTANT:  Merlene Pulling, PA-C, (Present throughout the entire procedure,  necessary for completion of procedure in a timely manner, assisting with retraction, instrumentation, and closure)  ?   ?ANESTHESIA: Spinal converted to general ? ?ESTIMATED BLOOD LOSS: 200 mL ?   ?COMPLICATIONS:  None.  ?   ?UNIQUE ASPECTS OF THE CASE: His anatomy was extremely thin, effectively cachectic.  I did not completely medialize on the acetabulum, and left bone, but had excellent coverage and fixation.  I was slightly anteverted and could see the anterior wall.  There was some posterior overhang of the cup.  On the femoral side I was trialing with a size 6, but it did not have rotational stability, so I was able to get up to a 7 but it was fairly tight, I left 1 tooth showing.  I debated whether he was too long, however he felt good clinically, the soft tissues just barely opposed to bone.  I also elected for stability given his history of stroke.  The 7 provided appropriate rotational stability of the stem, as well as leg length  restoration. ? ?COMPONENTS:  [employer] fit femur size 7 with a 36 mm + 1.5 metallic head ball and a Gription Acetabular shell size 52, with a single cancellous screw for backup fixation, with an apex hole eliminator and a +4 neutral polyethylene liner. ?   ?PROCEDURE IN DETAIL:  ? ?The patient was met in the holding area and  identified.  The appropriate hip was identified and marked at the operative site.  The patient was then transported to the OR  and  placed under anesthesia.  At that point, the patient was  placed in the lateral decubitus position with the operative side up and  secured to the operating room table and all bony prominences padded.  ?   ?The operative lower extremity was prepped from the iliac crest to the distal leg.  Sterile draping was performed.  Time out was performed prior to incision.   ?   ?A routine posterolateral approach was utilized via sharp dissection  carried down to the subcutaneous tissue.  Gross bleeders were Bovie coagulated.  The iliotibial band was identified and incised along the length of the skin incision.  Self-retaining retractors were  inserted.  With the hip internally rotated, the short external rotators  were identified. The piriformis and capsule was tagged with FiberWire, and the hip capsule released in a T-type fashion. ? ?The femoral neck was exposed, and I resected the femoral neck using the appropriate jig. This was performed at approximately a thumb's breadth above the lesser trochanter. ?   ?I then exposed the deep acetabulum, cleared out  any tissue including the ligamentum teres.  A wing retractor was placed.  After adequate visualization, I excised the labrum, and then sequentially reamed.  I placed the trial acetabulum, which seated nicely, and then impacted the real cup into place.  Appropriate version and inclination was confirmed clinically matching their bony anatomy, and also with the use of the jig.  I placed a cancellous screw to  augment fixation. ? ?A trial polyethylene liner was placed and the wing retractor removed. ?   ?I then prepared the proximal femur using the cookie-cutter, the lateralizing reamer, and then sequentially reamed and broached. ? ?A trial broach, neck, and head was utilized, and I reduced the hip and it was found to have excellent stability with functional range of motion. The trial components were then removed, and the real polyethylene liner was placed. ? ?I then impacted the real femoral prosthesis into place into the appropriate version, slightly anteverted to the normal anatomy, and I impacted the real head ball into place. The hip was then reduced and taken through functional range of motion and found to have excellent stability. Leg lengths were restored. ? ?I then used a 2 mm drill bits to pass the FiberWire suture from the capsule and piriformis through the greater trochanter, and secured this. Excellent posterior capsular repair was achieved. I also closed the T in the capsule. ? ?I then irrigated the hip copiously again with pulse lavage, and repaired the fascia with Vicryl, followed by Vicryl for the subcutaneous tissue, Monocryl for the skin, Steri-Strips and sterile gauze. The wounds were injected. The patient was then awakened and returned to PACU in stable and satisfactory condition. There were no complications. ? ?Marchia Bond, MD ?Orthopedic Surgeon ?[phone]  ? ?12/02/2021 5:28 PM ? ? ? ? ?

## 2021-12-02 NOTE — Discharge Instructions (Signed)
INSTRUCTIONS AFTER JOINT REPLACEMENT  ? ?Remove items at home which could result in a fall. This includes throw rugs or furniture in walking pathways ?ICE to the affected joint every three hours while awake for 30 minutes at a time, for at least the first 3-5 days, and then as needed for pain and swelling.  Continue to use ice for pain and swelling. You may notice swelling that will progress down to the foot and ankle.  This is normal after surgery.  Elevate your leg when you are not up walking on it.   ?Continue to use the breathing machine you got in the hospital (incentive spirometer) which will help keep your temperature down.  It is common for your temperature to cycle up and down following surgery, especially at night when you are not up moving around and exerting yourself.  The breathing machine keeps your lungs expanded and your temperature down. ? ? ?DIET:  As you were doing prior to hospitalization, we recommend a well-balanced diet. ? ?DRESSING / WOUND CARE / SHOWERING ? ?You may shower 3 days after surgery, but keep the wounds dry during showering.  You may use an occlusive plastic wrap (Press'n Seal for example), NO SOAKING/SUBMERGING IN THE BATHTUB.  If the bandage gets wet, change with a clean dry gauze.  If the incision gets wet, pat the wound dry with a clean towel. ? ?ACTIVITY ? ?Increase activity slowly as tolerated, but follow the weight bearing instructions below.   ?No driving for 6 weeks or until further direction given by your physician.  You cannot drive while taking narcotics.  ?No lifting or carrying greater than 10 lbs. until further directed by your surgeon. ?Avoid periods of inactivity such as sitting longer than an hour when not asleep. This helps prevent blood clots.  ?You may return to work once you are authorized by your doctor.  ? ? ? ?WEIGHT BEARING  ? ?Weight bearing as tolerated with assist device (walker, cane, etc) as directed, use it as long as suggested by your surgeon or  therapist, typically at least 4-6 weeks. ? ? ?EXERCISES ? ?Results after joint replacement surgery are often greatly improved when you follow the exercise, range of motion and muscle strengthening exercises prescribed by your doctor. Safety measures are also important to protect the joint from further injury. Any time any of these exercises cause you to have increased pain or swelling, decrease what you are doing until you are comfortable again and then slowly increase them. If you have problems or questions, call your caregiver or physical therapist for advice.  ? ?Rehabilitation is important following a joint replacement. After just a few days of immobilization, the muscles of the leg can become weakened and shrink (atrophy).  These exercises are designed to build up the tone and strength of the thigh and leg muscles and to improve motion. Often times heat used for twenty to thirty minutes before working out will loosen up your tissues and help with improving the range of motion but do not use heat for the first two weeks following surgery (sometimes heat can increase post-operative swelling).  ? ?These exercises can be done on a training (exercise) mat, on the floor, on a table or on a bed. Use whatever works the best and is most comfortable for you.    Use music or television while you are exercising so that the exercises are a pleasant break in your day. This will make your life better with the exercises acting  as a break in your routine that you can look forward to.   Perform all exercises about fifteen times, three times per day or as directed.  You should exercise both the operative leg and the other leg as well. ? ?Exercises include: ?  ?Quad Sets - Tighten up the muscle on the front of the thigh (Quad) and hold for 5-10 seconds.   ?Straight Leg Raises - With your knee straight (if you were given a brace, keep it on), lift the leg to 60 degrees, hold for 3 seconds, and slowly lower the leg.  Perform this  exercise against resistance later as your leg gets stronger.  ?Leg Slides: Lying on your back, slowly slide your foot toward your buttocks, bending your knee up off the floor (only go as far as is comfortable). Then slowly slide your foot back down until your leg is flat on the floor again.  ?Angel Wings: Lying on your back spread your legs to the side as far apart as you can without causing discomfort.  ?Hamstring Strength:  Lying on your back, push your heel against the floor with your leg straight by tightening up the muscles of your buttocks.  Repeat, but this time bend your knee to a comfortable angle, and push your heel against the floor.  You may put a pillow under the heel to make it more comfortable if necessary.  ? ?A rehabilitation program following joint replacement surgery can speed recovery and prevent re-injury in the future due to weakened muscles. Contact your doctor or a physical therapist for more information on knee rehabilitation.  ? ? ?CONSTIPATION ? ?Constipation is defined medically as fewer than three stools per week and severe constipation as less than one stool per week.  Even if you have a regular bowel pattern at home, your normal regimen is likely to be disrupted due to multiple reasons following surgery.  Combination of anesthesia, postoperative narcotics, change in appetite and fluid intake all can affect your bowels.  ? ?YOU MUST use at least one of the following options; they are listed in order of increasing strength to get the job done.  They are all available over the counter, and you may need to use some, POSSIBLY even all of these options:   ? ?Drink plenty of fluids (prune juice may be helpful) and high fiber foods ?Colace 100 mg by mouth twice a day  ?Senokot for constipation as directed and as needed Dulcolax (bisacodyl), take with full glass of water  ?Miralax (polyethylene glycol) once or twice a day as needed. ? ?If you have tried all these things and are unable to have a  bowel movement in the first 3-4 days after surgery call either your surgeon or your primary doctor.   ? ?If you experience loose stools or diarrhea, hold the medications until you stool forms back up.  If your symptoms do not get better within 1 week or if they get worse, check with your doctor.  If you experience "the worst abdominal pain ever" or develop nausea or vomiting, please contact the office immediately for further recommendations for treatment. ? ? ?ITCHING:  If you experience itching with your medications, try taking only a single pain pill, or even half a pain pill at a time.  You can also use Benadryl over the counter for itching or also to help with sleep.  ? ?TED HOSE STOCKINGS:  Use stockings on both legs until for at least 2 weeks or as directed by  physician office. They may be removed at night for sleeping. ? ?MEDICATIONS:  See your medication summary on the ?After Visit Summary? that nursing will review with you.  You may have some home medications which will be placed on hold until you complete the course of blood thinner medication.  It is important for you to complete the blood thinner medication as prescribed. ? ?PRECAUTIONS:  If you experience chest pain or shortness of breath - call 911 immediately for transfer to the hospital emergency department.  ? ?If you develop a fever greater that 101 F, purulent drainage from wound, increased redness or drainage from wound, foul odor from the wound/dressing, or calf pain - CONTACT YOUR SURGEON.   ?                                                ?FOLLOW-UP APPOINTMENTS:  If you do not already have a post-op appointment, please call the office for an appointment to be seen by your surgeon.  Guidelines for how soon to be seen are listed in your ?After Visit Summary?, but are typically between 1-4 weeks after surgery. ? ?OTHER INSTRUCTIONS:  ? ? ?POST-OPERATIVE OPIOID TAPER INSTRUCTIONS: ?It is important to wean off of your opioid medication as soon as  possible. If you do not need pain medication after your surgery it is ok to stop day one. ?Opioids include: ?Codeine, Hydrocodone(Norco, Vicodin), Oxycodone(Percocet, oxycontin) and hydromorphone amongst

## 2021-12-02 NOTE — Transfer of Care (Signed)
Immediate Anesthesia Transfer of Care Note ? ?Patient: Terry Knox ? ?Procedure(s) Performed: TOTAL HIP ARTHROPLASTY (Right: Hip) ? ?Patient Location: PACU ? ?Anesthesia Type:General ? ?Level of Consciousness: drowsy and patient cooperative ? ?Airway & Oxygen Therapy: Patient Spontanous Breathing and Patient connected to face mask oxygen ? ?Post-op Assessment: Report given to RN and Post -op Vital signs reviewed and stable ? ?Post vital signs: Reviewed and stable ? ?Last Vitals:  ?Vitals Value Taken Time  ?BP 114/61 12/02/21 1746  ?Temp    ?Pulse 58 12/02/21 1748  ?Resp 10 12/02/21 1748  ?SpO2 100 % 12/02/21 1748  ?Vitals shown include unvalidated device data. ? ?Last Pain:  ?Vitals:  ? 12/02/21 1155  ?TempSrc:   ?PainSc: 8   ?   ? ?Patients Stated Pain Goal: 3 (12/02/21 1155) ? ?Complications: No notable events documented. ?

## 2021-12-02 NOTE — Anesthesia Postprocedure Evaluation (Addendum)
Anesthesia Post Note ? ?Patient: Terry Knox ? ?Procedure(s) Performed: TOTAL HIP ARTHROPLASTY (Right: Hip) ? ?  ? ?Patient location during evaluation: PACU ?Anesthesia Type: General ?Level of consciousness: awake and alert, oriented and patient cooperative ?Pain management: pain level controlled ?Vital Signs Assessment: post-procedure vital signs reviewed and stable ?Respiratory status: spontaneous breathing, nonlabored ventilation and respiratory function stable ?Cardiovascular status: blood pressure returned to baseline and stable ?Postop Assessment: no apparent nausea or vomiting, spinal receding and patient able to bend at knees (pt with dense SAB on arrival o PACU) ?Anesthetic complications: no ? ? ?No notable events documented. ? ?Last Vitals:  ?Vitals:  ? 12/02/21 1817 12/02/21 1830  ?BP: 117/66 119/63  ?Pulse: (!) 59 (!) 59  ?Resp: 18 15  ?Temp:  36.5 ?C  ?SpO2: 100% 99%  ?  ?Last Pain:  ?Vitals:  ? 12/02/21 1830  ?TempSrc:   ?PainSc: 0-No pain  ? ? ?  ?  ?  ?  ?  ?  ? ?Shanicqua Coldren,E. Becki Mccaskill ? ? ? ? ?

## 2021-12-02 NOTE — Anesthesia Procedure Notes (Signed)
Procedure Name: Benson ?Date/Time: 12/02/2021 3:30 PM ?Performed by: Renato Shin, CRNA ?Pre-anesthesia Checklist: Patient identified, Emergency Drugs available, Suction available and Patient being monitored ?Patient Re-evaluated:Patient Re-evaluated prior to induction ?Oxygen Delivery Method: Simple face mask ?Preoxygenation: Pre-oxygenation with 100% oxygen ?Induction Type: IV induction ?Placement Confirmation: positive ETCO2 and breath sounds checked- equal and bilateral ?Dental Injury: Teeth and Oropharynx as per pre-operative assessment  ? ? ? ? ?

## 2021-12-02 NOTE — Anesthesia Procedure Notes (Signed)
Procedure Name: LMA Insertion ?Date/Time: 12/02/2021 3:52 PM ?Performed by: Renato Shin, CRNA ?Pre-anesthesia Checklist: Patient identified, Emergency Drugs available, Suction available and Patient being monitored ?Patient Re-evaluated:Patient Re-evaluated prior to induction ?Oxygen Delivery Method: Circle system utilized ?Preoxygenation: Pre-oxygenation with 100% oxygen ?Induction Type: IV induction ?Ventilation: Mask ventilation without difficulty ?LMA: LMA inserted ?LMA Size: 4.0 ?Number of attempts: 1 ?Placement Confirmation: positive ETCO2 and breath sounds checked- equal and bilateral ?Tube secured with: Tape ?Dental Injury: Teeth and Oropharynx as per pre-operative assessment  ? ? ? ? ?

## 2021-12-03 ENCOUNTER — Other Ambulatory Visit: Payer: Self-pay

## 2021-12-03 ENCOUNTER — Encounter (HOSPITAL_COMMUNITY): Payer: Self-pay | Admitting: Orthopedic Surgery

## 2021-12-03 DIAGNOSIS — Z79899 Other long term (current) drug therapy: Secondary | ICD-10-CM | POA: Diagnosis not present

## 2021-12-03 DIAGNOSIS — I10 Essential (primary) hypertension: Secondary | ICD-10-CM | POA: Diagnosis not present

## 2021-12-03 DIAGNOSIS — Z96641 Presence of right artificial hip joint: Secondary | ICD-10-CM | POA: Diagnosis present

## 2021-12-03 DIAGNOSIS — M1611 Unilateral primary osteoarthritis, right hip: Secondary | ICD-10-CM | POA: Diagnosis not present

## 2021-12-03 DIAGNOSIS — Z7902 Long term (current) use of antithrombotics/antiplatelets: Secondary | ICD-10-CM | POA: Diagnosis not present

## 2021-12-03 DIAGNOSIS — F17210 Nicotine dependence, cigarettes, uncomplicated: Secondary | ICD-10-CM | POA: Diagnosis not present

## 2021-12-03 DIAGNOSIS — Z7982 Long term (current) use of aspirin: Secondary | ICD-10-CM | POA: Diagnosis not present

## 2021-12-03 LAB — BASIC METABOLIC PANEL
Anion gap: 5 (ref 5–15)
BUN: 22 mg/dL (ref 8–23)
CO2: 27 mmol/L (ref 22–32)
Calcium: 8.7 mg/dL — ABNORMAL LOW (ref 8.9–10.3)
Chloride: 101 mmol/L (ref 98–111)
Creatinine, Ser: 0.84 mg/dL (ref 0.61–1.24)
GFR, Estimated: >60 mL/min (ref >60)
Glucose, Bld: 152 mg/dL — ABNORMAL HIGH (ref 70–99)
Potassium: 4.7 mmol/L (ref 3.5–5.1)
Sodium: 133 mmol/L — ABNORMAL LOW (ref 135–145)

## 2021-12-03 LAB — CBC
HCT: 31.4 % — ABNORMAL LOW (ref 39.0–52.0)
Hemoglobin: 10.3 g/dL — ABNORMAL LOW (ref 13.0–17.0)
MCH: 32.3 pg (ref 26.0–34.0)
MCHC: 32.8 g/dL (ref 30.0–36.0)
MCV: 98.4 fL (ref 80.0–100.0)
Platelets: 235 10*3/uL (ref 150–400)
RBC: 3.19 MIL/uL — ABNORMAL LOW (ref 4.22–5.81)
RDW: 13 % (ref 11.5–15.5)
WBC: 11 10*3/uL — ABNORMAL HIGH (ref 4.0–10.5)
nRBC: 0 % (ref 0.0–0.2)

## 2021-12-03 MED ORDER — HYDROCODONE-ACETAMINOPHEN 10-325 MG PO TABS: 1.0000 | ORAL_TABLET | ORAL | 0 refills | Status: DC | PRN

## 2021-12-03 MED ORDER — ONDANSETRON HCL 4 MG PO TABS: 4.0000 mg | ORAL_TABLET | Freq: Three times a day (TID) | ORAL | 0 refills | Status: DC | PRN

## 2021-12-03 MED ORDER — SENNA-DOCUSATE SODIUM 8.6-50 MG PO TABS: 2.0000 | ORAL_TABLET | Freq: Every day | ORAL | 1 refills | Status: DC

## 2021-12-03 MED ORDER — ASPIRIN EC 81 MG PO TBEC: 81.0000 mg | DELAYED_RELEASE_TABLET | Freq: Two times a day (BID) | ORAL | 0 refills | Status: DC

## 2021-12-03 MED ORDER — ASPIRIN EC 325 MG PO TBEC: 325.0000 mg | DELAYED_RELEASE_TABLET | Freq: Two times a day (BID) | ORAL | 0 refills | Status: DC

## 2021-12-03 NOTE — Discharge Summary (Signed)
Discharge Summary  ?Patient ID: ?Terry Knox ?MRN: [id number] ?DOB/AGE: 1940-10-08 81 y.o. ? ?Admit date: 12/02/2021 ?Discharge date: 12/03/2021 ? ?Admission Diagnoses:  ?Osteoarthritis of right hip ? ?Discharge Diagnoses:  ?Principal Problem: ?  Osteoarthritis of right hip ?Active Problems: ?  S/P hip replacement, right ?  S/P total right hip arthroplasty ? ? ?Past Medical History:  ?Diagnosis Date  ? Hyperlipidemia   ? Hypertension   ? Stroke [facility] 09/2021  ? ? ?Surgeries: Procedure(s): ?TOTAL HIP ARTHROPLASTY on 12/02/2021 ?  ?Consultants (if any):  ? ?Discharged Condition: Improved ? ?Hospital Course: Terry Knox is an 81 y.o. male who was admitted 12/02/2021 with a diagnosis of Osteoarthritis of right hip and went to the operating room on 12/02/2021 and underwent the above named procedures.   ? ?He was given perioperative antibiotics:  ?Anti-infectives (From admission, onward)  ? ? Start     Dose/Rate Route Frequency Ordered Stop  ? 12/02/21 2200  ceFAZolin (ANCEF) IVPB 1 g/50 mL premix       ? 1 g ?100 mL/hr over 30 Minutes Intravenous Every 6 hours 12/02/21 1915 12/03/21 1141  ? 12/02/21 1145  ceFAZolin (ANCEF) IVPB 2g/100 mL premix       ? 2 g ?200 mL/hr over 30 Minutes Intravenous On call to O.R. 12/02/21 1139 12/02/21 1613  ? ?  ?. ? ?He was given sequential compression devices, early ambulation, and will return to his baseline anticoagulant for DVT prophylaxis. ? ?He benefited maximally from the hospital stay and there were no complications.   ? ?Recent vital signs:  ?Vitals:  ? 12/03/21 0952 12/03/21 1409  ?BP: 138/72 (!) 114/56  ?Pulse: 68 65  ?Resp: 18 16  ?Temp: 97.8 ?F (36.6 ?C) 97.7 ?F (36.5 ?C)  ?SpO2: 98% 97%  ? ? ?Recent laboratory studies:  ?Lab Results  ?Component Value Date  ? HGB 10.3 (L) 12/03/2021  ? HGB 12.4 (L) 11/19/2021  ? HGB 12.8 (L) 07/26/2020  ? ?Lab Results  ?Component Value Date  ? WBC 11.0 (H) 12/03/2021  ? PLT 235 12/03/2021  ? ?Lab Results  ?Component Value Date  ? INR 1.19 06/06/2015   ? ?Lab Results  ?Component Value Date  ? NA 133 (L) 12/03/2021  ? K 4.7 12/03/2021  ? CL 101 12/03/2021  ? CO2 27 12/03/2021  ? BUN 22 12/03/2021  ? CREATININE 0.84 12/03/2021  ? GLUCOSE 152 (H) 12/03/2021  ? ? ?Discharge Medications:   ?Allergies as of 12/03/2021   ?No Known Allergies ?  ? ?  ?Medication List  ?  ? ?STOP taking these medications   ? ?acetaminophen 325 MG tablet ?Commonly known as: TYLENOL ?  ? ?  ? ?TAKE these medications   ? ?albuterol 108 (90 Base) MCG/ACT inhaler ?Commonly known as: VENTOLIN HFA ?Inhale into the lungs. ?  ?amLODipine 2.5 MG tablet ?Commonly known as: NORVASC ?Take 1 tablet (2.5 mg total) by mouth daily. ?  ?aspirin EC 81 MG tablet ?Take 1 tablet (81 mg total) by mouth 2 (two) times daily. For DVT prophylaxis for 30 days after surgery. ?What changed:  ?when to take this ?additional instructions ?  ?atorvastatin 40 MG tablet ?Commonly known as: LIPITOR ?Take 40 mg by mouth at bedtime. ?  ?clopidogrel 75 MG tablet ?Commonly known as: PLAVIX ?Take 75 mg by mouth daily. ?  ?HYDROcodone-acetaminophen 10-325 MG tablet ?Commonly known as: Norco ?Take 1 tablet by mouth every 4 (four) hours as needed. ?  ?lisinopril 10 MG tablet ?Commonly  known as: ZESTRIL ?Take 10 mg by mouth daily. ?  ?ondansetron 4 MG tablet ?Commonly known as: Zofran ?Take 1 tablet (4 mg total) by mouth every 8 (eight) hours as needed for nausea or vomiting. ?  ?potassium chloride 10 MEQ tablet ?Commonly known as: KLOR-CON ?Take 10 mEq by mouth daily. ?  ?sennosides-docusate sodium 8.6-50 MG tablet ?Commonly known as: SENOKOT-S ?Take 2 tablets by mouth daily. ?  ?timolol 0.5 % ophthalmic gel-forming ?Commonly known as: TIMOPTIC-XR ?Place 1 drop into the left eye 2 (two) times daily. ?  ? ?  ? ?  ?  ? ? ?  ?Durable Medical Equipment  ?(From admission, onward)  ?  ? ? ?  ? ?  Start     Ordered  ? 12/03/21 1042  For home use only DME 3 n 1  Once       ? 12/03/21 1041  ? ?  ?  ? ?  ? ? ?Diagnostic Studies: DG Pelvis  Portable ? ?Result Date: 12/02/2021 ?CLINICAL DATA:  Status post total hip arthroplasty. EXAM: PORTABLE PELVIS 1-2 VIEWS COMPARISON:  None. FINDINGS: There is a new right hip arthroplasty in anatomic alignment. There is surrounding soft tissue air compatible with recent surgery. There are surgical clips in the right thigh. No acute fracture. IMPRESSION: New right hip total arthroplasty in anatomic alignment. Electronically Signed   By: Ronney Asters M.D.   On: 12/02/2021 19:22  ? ?DG HIP UNILAT WITH PELVIS 2-3 VIEWS RIGHT ? ?Result Date: 12/02/2021 ?CLINICAL DATA:  Status post right hip arthroplasty EXAM: DG HIP (WITH OR WITHOUT PELVIS) 1V RIGHT COMPARISON:  None. FINDINGS: Single cross-table lateral view of a right hip arthroplasty, in satisfactory position. Associated soft tissue gas. No fracture is seen. At least 2 tiny radiodensities are present, adjacent to the acetabular cup and femoral stem, reflecting known foreign bodies. IMPRESSION: Right hip arthroplasty in satisfactory position on this single lateral view. Electronically Signed   By: Julian Hy M.D.   On: 12/02/2021 19:21   ? ?Disposition: Discharge disposition: 06-Home-Health Care Svc ? ? ? ? ? ? ? ? ? Follow-up Information   ? ? Marchia Bond, MD. Go on 12/15/2021.   ?Specialty: Orthopedic Surgery ?Why: Your appointment is scheduled for 2:00. ?Contact information: ?[address] ?[address] ?[place] [address] ?[phone] ? ? ?  ?  ? ? [facility] [place] Follow up.   ?Specialty: Home Health Services ?Why: HHPT will provide 6 home visits prior to starting outpatient physical therapy ?Contact information: ?[address] ?[address] ?[place] [address] ?[phone] ? ? ?  ?  ? ? [employer] Go on 12/15/2021.   ?Why: Your outpatient physical therapy appointment is scheduled for 1:15. Please arrive at 1:00 to complete your paperwork ?Contact information: ?[facility] ?[place] [address] ?[phone] ? ? ?  ?  ? ?  ?  ? ?  ? ? ? ?Signed: ?Ventura Bruns PA-C ?12/03/2021, 2:24 PM ? ?  ?

## 2021-12-03 NOTE — TOC Transition Note (Addendum)
Transition of Care (TOC) - CM/SW Discharge Note ? ?Patient Details  ?Name: Terry Knox ?MRN: [id number] ?Date of Birth: 11-14-1940 ? ?Transition of Care (TOC) CM/SW Contact:  ?Sherie Don, LCSW ?Phone Number: ?12/03/2021, 9:50 AM ? ?Clinical Narrative: Patient is expected to discharge home after working with PT. CSW met with patient to confirm discharge plan and needs. Patient will go home with HHPT through [facility] and then transition to OPPT at [facility]. Patient will need a rolling walker. [employer] to deliver walker to patient's room. TOC signing off. ? ?Addendum: Patient will be confined to mainly one room in his daughter's home and will need a 3N1. 3N1 ordered through [employer], which was delivered to patient's room. ? ?Final next level of care: [place] ?Barriers to Discharge: No Barriers Identified ? ?Patient Goals and CMS Choice ?Patient states their goals for this hospitalization and ongoing recovery are:: Discharge home with HHPT ?CMS Medicare.gov Compare Post Acute Care list provided to:: Patient ?Choice offered to / list presented to : Patient ? ?Discharge Plan and Services         ?DME Arranged: Walker rolling ?DME Agency: [employer] ?Representative spoke with at DME Agency: Prearranged in orthopedist's office ?HH Arranged: PT ?[place] Agency: [place] ?Representative spoke with at [place]: Prearranged in orthopedist's office ? ?Readmission Risk Interventions ?   ? View : No data to display.  ?  ?  ?  ? ?

## 2021-12-03 NOTE — Progress Notes (Signed)
Discharge package printed and instructions given to patient. Patient verbalizes understanding. 

## 2021-12-03 NOTE — Progress Notes (Signed)
? ? ? ?Subjective: ?1 Day Post-Op s/p Procedure(s): ?TOTAL HIP ARTHROPLASTY ? ? Patient is alert, oriented. States pain is moderate and located directly at right hip. Denies chest pain, SOB, Calf pain. No nausea/vomiting. Eating full breakfast. Voiding well. No other complaints. ?  ? ?Objective:  ?PE: ?VITALS:   ?Vitals:  ? 12/02/21 1914 12/02/21 2111 12/03/21 0118 12/03/21 0523  ?BP: 96/61 (!) 134/53 128/65 112/61  ?Pulse: 65 64 64 66  ?Resp:  16 16 16   ?Temp: 97.8 ?F (36.6 ?C) 97.7 ?F (36.5 ?C) 97.9 ?F (36.6 ?C) 97.8 ?F (36.6 ?C)  ?TempSrc: Oral Oral Oral Oral  ?SpO2: 93% 99% 97% 95%  ?Weight:      ?Height:      ? ? ?ABD soft ?Sensation intact distally ?Intact pulses distally ?Dorsiflexion/Plantar flexion intact ?Incision: scant drainage ? ?LABS ? ?Results for orders placed or performed during the hospital encounter of 12/02/21 (from the past 24 hour(s))  ?ABO/Rh     Status: None  ? Collection Time: 12/02/21 12:05 PM  ?Result Value Ref Range  ? ABO/RH(D)    ?  O POS ?Performed at [facility], [place] [address], [place], [place] [address] ?  ?CBC     Status: Abnormal  ? Collection Time: 12/03/21  3:26 AM  ?Result Value Ref Range  ? WBC 11.0 (H) 4.0 - 10.5 K/uL  ? RBC 3.19 (L) 4.22 - 5.81 MIL/uL  ? Hemoglobin 10.3 (L) 13.0 - 17.0 g/dL  ? HCT 31.4 (L) 39.0 - 52.0 %  ? MCV 98.4 80.0 - 100.0 fL  ? MCH 32.3 26.0 - 34.0 pg  ? MCHC 32.8 30.0 - 36.0 g/dL  ? RDW 13.0 11.5 - 15.5 %  ? Platelets 235 150 - 400 K/uL  ? nRBC 0.0 0.0 - 0.2 %  ?Basic metabolic panel     Status: Abnormal  ? Collection Time: 12/03/21  3:26 AM  ?Result Value Ref Range  ? Sodium 133 (L) 135 - 145 mmol/L  ? Potassium 4.7 3.5 - 5.1 mmol/L  ? Chloride 101 98 - 111 mmol/L  ? CO2 27 22 - 32 mmol/L  ? Glucose, Bld 152 (H) 70 - 99 mg/dL  ? BUN 22 8 - 23 mg/dL  ? Creatinine, Ser 0.84 0.61 - 1.24 mg/dL  ? Calcium 8.7 (L) 8.9 - 10.3 mg/dL  ? GFR, Estimated >60 >60 mL/min  ? Anion gap 5 5 - 15  ? ? ?DG Pelvis Portable ? ?Result Date:  12/02/2021 ?CLINICAL DATA:  Status post total hip arthroplasty. EXAM: PORTABLE PELVIS 1-2 VIEWS COMPARISON:  None. FINDINGS: There is a new right hip arthroplasty in anatomic alignment. There is surrounding soft tissue air compatible with recent surgery. There are surgical clips in the right thigh. No acute fracture. IMPRESSION: New right hip total arthroplasty in anatomic alignment. Electronically Signed   By: Ronney Asters M.D.   On: 12/02/2021 19:22  ? ?DG HIP UNILAT WITH PELVIS 2-3 VIEWS RIGHT ? ?Result Date: 12/02/2021 ?CLINICAL DATA:  Status post right hip arthroplasty EXAM: DG HIP (WITH OR WITHOUT PELVIS) 1V RIGHT COMPARISON:  None. FINDINGS: Single cross-table lateral view of a right hip arthroplasty, in satisfactory position. Associated soft tissue gas. No fracture is seen. At least 2 tiny radiodensities are present, adjacent to the acetabular cup and femoral stem, reflecting known foreign bodies. IMPRESSION: Right hip arthroplasty in satisfactory position on this single lateral view. Electronically Signed   By: Julian Hy M.D.   On: 12/02/2021 19:21   ? ?  Assessment/Plan: ? ?1 Day Post-Op s/p Procedure(s): ?TOTAL HIP ARTHROPLASTY ? ?Weightbearing: WBAT RLE ?Insicional and dressing care: Dressings left intact until follow-up ?VTE prophylaxis: Home plavix and aspirin ?Pain control: continue current regimen ?Follow - up plan: 2 weeks with Dr. [name] ?Dispo: pending progress with PT today, possibly discharge home this afternoon ? ?Contact information:   ?Weekdays [address], [place] [phone] A ?fter hours and holidays please check [email or web address] for group call information for [facility] ? ?Ventura Bruns ?12/03/2021, 9:14 AM  ?

## 2021-12-03 NOTE — Evaluation (Signed)
Physical Therapy Evaluation ?Patient Details ?Name: Terry Knox ?MRN: [id number] ?DOB: [birth year] ?Today's Date: 12/03/2021 ? ?History of Present Illness ? 81 y.o. male admitted 12/02/21 for R posterior THA. PMH includes CVA 07/2020, HTN, HLD, blind R eye and poor vision L eye.  ?Clinical Impression ? Pt is s/p THA resulting in the deficits listed below (see PT Problem List). Pt ambulated 130' with RW, no loss of balance. Will return this afternoon for stair training.  Pt will benefit from skilled PT to increase their independence and safety with mobility to allow discharge to the venue listed below.  ?   ?   ? ?Recommendations for follow up therapy are one component of a multi-disciplinary discharge planning process, led by the attending physician.  Recommendations may be updated based on patient status, additional functional criteria and insurance authorization. ? ?Follow Up Recommendations Follow physician's recommendations for discharge plan and follow up therapies ? ?  ?Assistance Recommended at Discharge Frequent or constant Supervision/Assistance  ?Patient can return home with the following ? A little help with bathing/dressing/bathroom;Help with stairs or ramp for entrance;Assist for transportation;Assistance with cooking/housework ? ?  ?Equipment Recommendations Rolling walker (2 wheels);BSC/3in1  ?Recommendations for Other Services ?    ?  ?Functional Status Assessment Patient has had a recent decline in their functional status and demonstrates the ability to make significant improvements in function in a reasonable and predictable amount of time.  ? ?  ?Precautions / Restrictions Precautions: in secure chat with Dr. [name], he stated pt does not have posterior precautions ? ?Restrictions ?Weight Bearing Restrictions: No ?RLE Weight Bearing: Weight bearing as tolerated  ? ?  ? ?Mobility ? Bed Mobility ?Overal bed mobility: Modified Independent ?  ?  ?  ?  ?  ?  ?General bed mobility comments: used bedrail, HOB  up ?  ? ?Transfers ?Overall transfer level: Needs assistance ?Equipment used: Rolling walker (2 wheels) ?Transfers: Sit to/from Stand ?Sit to Stand: Min guard, From elevated surface ?  ?  ?  ?  ?  ?General transfer comment: VCs hand placement ?  ? ?Ambulation/Gait ?Ambulation/Gait assistance: Min guard ?Gait Distance (Feet): 130 Feet ?Assistive device: Rolling walker (2 wheels) ?Gait Pattern/deviations: Step-to pattern, Decreased step length - right, Decreased step length - left ?Gait velocity: WFL ?  ?  ?General Gait Details: steady with RW, no loss of balance ? ?Stairs ?  ?  ?  ?  ?  ? ?Wheelchair Mobility ?  ? ?Modified Rankin (Stroke Patients Only) ?  ? ?  ? ?Balance Overall balance assessment: Modified Independent ?  ?  ?  ?  ?  ?  ?  ?  ?  ?  ?  ?  ?  ?  ?  ?  ?  ?  ?   ? ? ? ?Pertinent Vitals/Pain Pain Assessment ?Pain Assessment: 0-10 ?Pain Score: 8  ?Pain Location: R hip ?Pain Descriptors / Indicators: Sore ?Pain Intervention(s): Limited activity within patient's tolerance, Monitored during session, Patient requesting pain meds-RN notified, Repositioned, Ice applied  ? ? ?Home Living Family/patient expects to be discharged to:: Private residence ?Living Arrangements: Children ?Available Help at Discharge: Family ?Type of Home: House ?Home Access: Stairs to enter ?  ?Entrance Stairs-Number of Steps: 1 ?  ?Home Layout: One level ?Home Equipment: None ?Additional Comments: DCing to daughter's home, info above is for her home  ?  ?Prior Function Prior Level of Function : Independent/Modified Independent ?  ?  ?  ?  ?  ?  ?  Mobility Comments: walked without AD, denies falls in past 6 months ?  ?  ? ? ?Hand Dominance  ?   ? ?  ?Extremity/Trunk Assessment  ? Upper Extremity Assessment ?Upper Extremity Assessment: Overall WFL for tasks assessed ?  ? ?Lower Extremity Assessment ?Lower Extremity Assessment: RLE deficits/detail ?RLE Deficits / Details: hip -3/5 grossly, knee ext at least 3/5 ?RLE Sensation: WNL ?RLE  Coordination: WNL ?  ? ?Cervical / Trunk Assessment ?Cervical / Trunk Assessment: Normal  ?Communication  ? Communication: HOH  ?Cognition Arousal/Alertness: Awake/alert ?Behavior During Therapy: [facility] for tasks assessed/performed ?Overall Cognitive Status: Within Functional Limits for tasks assessed ?  ?  ?  ?  ?  ?  ?  ?  ?  ?  ?  ?  ?  ?  ?  ?  ?General Comments: not able to read small print 2* visual deficits ?  ?  ? ?  ?General Comments   ? ?  ?Exercises Total Joint Exercises ?Ankle Circles/Pumps: AROM, Both, 10 reps, Supine ?Heel Slides: AAROM, Right, 10 reps, Supine ?Hip ABduction/ADduction: AAROM, Right, 10 reps, Supine ?Long Arc Quad: AROM, Right, 5 reps, Seated  ? ?Assessment/Plan  ?  ?PT Assessment Patient needs continued PT services  ?PT Problem List Decreased activity tolerance;Pain;Decreased mobility;Decreased knowledge of precautions;Decreased knowledge of use of DME;Decreased range of motion;Decreased strength ? ?   ?  ?PT Treatment Interventions DME instruction;Therapeutic activities;Therapeutic exercise;Gait training;Stair training;Functional mobility training;Patient/family education   ? ?PT Goals (Current goals can be found in the Care Plan section)  ?Acute Rehab PT Goals ?Patient Stated Goal: to be able to garden ?PT Goal Formulation: With patient ?Time For Goal Achievement: 12/10/21 ?Potential to Achieve Goals: Good ? ?  ?Frequency BID ?  ? ? ?Co-evaluation   ?  ?  ?  ?  ? ? ?  ?AM-PAC PT "6 Clicks" Mobility  ?Outcome Measure Help needed turning from your back to your side while in a flat bed without using bedrails?: None ?Help needed moving from lying on your back to sitting on the side of a flat bed without using bedrails?: A Little ?Help needed moving to and from a bed to a chair (including a wheelchair)?: A Little ?Help needed standing up from a chair using your arms (e.g., wheelchair or bedside chair)?: A Little ?Help needed to walk in hospital room?: A Little ?Help needed climbing 3-5 steps  with a railing? : A Little ?6 Click Score: 19 ? ?  ?End of Session Equipment Utilized During Treatment: Gait belt ?Activity Tolerance: Patient tolerated treatment well ?Patient left: in chair;with call bell/phone within reach;with chair alarm set ?Nurse Communication: Mobility status ?PT Visit Diagnosis: Difficulty in walking, not elsewhere classified (R26.2);Pain ?Pain - Right/Left: Right ?Pain - part of body: Hip ?  ? ?Time: 0321-2248 ?PT Time Calculation (min) (ACUTE ONLY): 33 min ? ? ?Charges:   PT Evaluation ?$PT Eval Moderate Complexity: 1 Mod ?PT Treatments ?$Gait Training: 8-22 mins ?  ?   ? ? ? ? ?

## 2021-12-03 NOTE — Care Management (Signed)
Patient will require the use of a 3N1 following discharge as he will be staying in his daughter's home and will be confined to one room in the home that does not have a bathroom. ? ? ?  ?Durable Medical Equipment  ?(From admission, onward)  ?  ? ? ?  ? ?  Start     Ordered  ? 12/03/21 1042  For home use only DME 3 n 1  Once       ? 12/03/21 1041  ? ?  ?  ? ?  ? ? ? ?

## 2021-12-03 NOTE — Progress Notes (Signed)
Physical Therapy Treatment ?Patient Details ?Name: Terry Knox ?MRN: [id number] ?DOB: 26-Jul-1941 ?Today's Date: 12/03/2021 ? ? ?History of Present Illness 81 y.o. male admitted 12/02/21 for R posterior THA. PMH includes CVA 07/2020, HTN, HLD, blind R eye and poor vision L eye. ? ?  ?PT Comments  ? ? Pt is mobilizing well, he ambulated 140' with RW, no loss of balance. Stair training completed. Reviewed THA HEP, pt demonstrates good understanding. Per secure chat text with Dr. [name], pt does not have posterior hip precautions. He is ready to DC home today. ? ?   ?Recommendations for follow up therapy are one component of a multi-disciplinary discharge planning process, led by the attending physician.  Recommendations may be updated based on patient status, additional functional criteria and insurance authorization. ? ?Follow Up Recommendations ? Follow physician's recommendations for discharge plan and follow up therapies ?  ?  ?Assistance Recommended at Discharge Frequent or constant Supervision/Assistance  ?Patient can return home with the following A little help with bathing/dressing/bathroom;Help with stairs or ramp for entrance;Assist for transportation;Assistance with cooking/housework ?  ?Equipment Recommendations ? Rolling walker (2 wheels);BSC/3in1  ?  ?Recommendations for Other Services   ? ? ?  ?Precautions / Restrictions Precautions ?Precautions: Posterior Hip ?Precaution Booklet Issued: No ?Precaution Comments: per secure chat with Dr. [name], pt does NOT have posterior precautions ?Restrictions ?Weight Bearing Restrictions: No ?RLE Weight Bearing: Weight bearing as tolerated  ?  ? ?Mobility ? Bed Mobility ?Overal bed mobility: Modified Independent ?  ?  ?  ?  ?  ?  ?General bed mobility comments: used bedrail, HOB up ?  ? ?Transfers ?Overall transfer level: Needs assistance ?Equipment used: Rolling walker (2 wheels) ?Transfers: Sit to/from Stand ?Sit to Stand: From elevated surface, Supervision ?  ?  ?  ?   ?  ?General transfer comment: VCs hand placement ?  ? ?Ambulation/Gait ?Ambulation/Gait assistance: Modified independent (Device/Increase time) ?Gait Distance (Feet): 140 Feet ?Assistive device: Rolling walker (2 wheels) ?Gait Pattern/deviations: Decreased step length - right, Decreased step length - left, Step-through pattern ?Gait velocity: WFL ?  ?  ?General Gait Details: steady with RW, no loss of balance ? ? ?Stairs ?Stairs: Yes ?Stairs assistance: Supervision ?Stair Management: One rail Left, Step to pattern, Forwards ?Number of Stairs: 5 ?General stair comments: VCs sequencing ? ? ?Wheelchair Mobility ?  ? ?Modified Rankin (Stroke Patients Only) ?  ? ? ?  ?Balance Overall balance assessment: Modified Independent ?  ?  ?  ?  ?  ?  ?  ?  ?  ?  ?  ?  ?  ?  ?  ?  ?  ?  ?  ? ?  ?Cognition Arousal/Alertness: Awake/alert ?Behavior During Therapy: [facility] for tasks assessed/performed ?Overall Cognitive Status: Within Functional Limits for tasks assessed ?  ?  ?  ?  ?  ?  ?  ?  ?  ?  ?  ?  ?  ?  ?  ?  ?General Comments: not able to read small print 2* visual deficits ?  ?  ? ?  ?Exercises Total Joint Exercises ?Ankle Circles/Pumps: AROM, Both, 10 reps, Supine ?Quad Sets: AROM, Right, 5 reps, Supine ?Short Arc Quad: AROM, Right, 10 reps, Supine ?Heel Slides: AAROM, Right, 10 reps, Supine ?Hip ABduction/ADduction: AAROM, Right, 10 reps, Supine ?Long Arc Quad: AROM, Right, Seated, 10 reps ? ?  ?General Comments   ?  ?  ? ?Pertinent Vitals/Pain Pain Assessment ?Pain Assessment: Faces ?Pain  Score: 4  ?Pain Location: R hip ?Pain Descriptors / Indicators: Sore ?Pain Intervention(s): Limited activity within patient's tolerance, Monitored during session, Repositioned, Ice applied, Premedicated before session  ? ? ?Home Living   ?  ?  ?  ?  ?  ?  ?  ?  ?  ?   ?  ?Prior Function    ?  ?  ?   ? ?PT Goals (current goals can now be found in the care plan section) Acute Rehab PT Goals ?Patient Stated Goal: to be able to garden ?PT Goal  Formulation: With patient ?Time For Goal Achievement: 12/10/21 ?Potential to Achieve Goals: Good ?Progress towards PT goals: Goals met/education completed, patient discharged from PT ? ?  ?Frequency ? ? ? BID ? ? ? ?  ?PT Plan Current plan remains appropriate  ? ? ?Co-evaluation   ?  ?  ?  ?  ? ?  ?AM-PAC PT "6 Clicks" Mobility   ?Outcome Measure ? Help needed turning from your back to your side while in a flat bed without using bedrails?: None ?Help needed moving from lying on your back to sitting on the side of a flat bed without using bedrails?: None ?Help needed moving to and from a bed to a chair (including a wheelchair)?: None ?Help needed standing up from a chair using your arms (e.g., wheelchair or bedside chair)?: None ?Help needed to walk in hospital room?: None ?Help needed climbing 3-5 steps with a railing? : A Little ?6 Click Score: 23 ? ?  ?End of Session Equipment Utilized During Treatment: Gait belt ?Activity Tolerance: Patient tolerated treatment well ?Patient left: in chair;with call bell/phone within reach;with chair alarm set ?Nurse Communication: Mobility status ?PT Visit Diagnosis: Difficulty in walking, not elsewhere classified (R26.2);Pain ?Pain - Right/Left: Right ?Pain - part of body: Hip ?  ? ? ?Time: 3300-7622 ?PT Time Calculation (min) (ACUTE ONLY): 21 min ? ?Charges:   ?$Therapeutic Exercise: 8-22 mins          ?          ? ?Philomena Doheny PT 12/03/2021  ?Acute Rehabilitation Services ?Pager [phone] ?Office [phone] ? ? ?

## 2021-12-03 NOTE — Plan of Care (Signed)
?  Problem: Activity: ?Goal: Risk for activity intolerance will decrease ?Outcome: Progressing ?  ?Problem: Safety: ?Goal: Ability to remain free from injury will improve ?Outcome: Progressing ?  ?Problem: Pain Managment: ?Goal: General experience of comfort will improve ?Outcome: Progressing ?  ?

## 2021-12-15 DIAGNOSIS — M1611 Unilateral primary osteoarthritis, right hip: Secondary | ICD-10-CM | POA: Diagnosis not present

## 2022-01-02 DIAGNOSIS — M1611 Unilateral primary osteoarthritis, right hip: Secondary | ICD-10-CM | POA: Diagnosis not present

## 2022-01-20 DIAGNOSIS — H52202 Unspecified astigmatism, left eye: Secondary | ICD-10-CM | POA: Diagnosis not present

## 2022-01-20 DIAGNOSIS — Z970 Presence of artificial eye: Secondary | ICD-10-CM | POA: Diagnosis not present

## 2022-01-20 DIAGNOSIS — H524 Presbyopia: Secondary | ICD-10-CM | POA: Diagnosis not present

## 2022-01-20 DIAGNOSIS — H401123 Primary open-angle glaucoma, left eye, severe stage: Secondary | ICD-10-CM | POA: Diagnosis not present

## 2022-01-20 DIAGNOSIS — Z961 Presence of intraocular lens: Secondary | ICD-10-CM | POA: Diagnosis not present

## 2022-01-20 DIAGNOSIS — H5212 Myopia, left eye: Secondary | ICD-10-CM | POA: Diagnosis not present

## 2022-02-04 DIAGNOSIS — M1611 Unilateral primary osteoarthritis, right hip: Secondary | ICD-10-CM | POA: Diagnosis not present

## 2022-02-27 DIAGNOSIS — M25511 Pain in right shoulder: Secondary | ICD-10-CM | POA: Diagnosis not present

## 2022-02-27 DIAGNOSIS — E440 Moderate protein-calorie malnutrition: Secondary | ICD-10-CM | POA: Diagnosis not present

## 2022-02-27 DIAGNOSIS — I2510 Atherosclerotic heart disease of native coronary artery without angina pectoris: Secondary | ICD-10-CM | POA: Diagnosis not present

## 2022-02-27 DIAGNOSIS — I10 Essential (primary) hypertension: Secondary | ICD-10-CM | POA: Diagnosis not present

## 2022-03-20 DIAGNOSIS — M1611 Unilateral primary osteoarthritis, right hip: Secondary | ICD-10-CM | POA: Diagnosis not present

## 2022-03-31 ENCOUNTER — Ambulatory Visit: Payer: Medicare HMO | Attending: Ophthalmology | Admitting: Occupational Therapy

## 2022-03-31 DIAGNOSIS — R41842 Visuospatial deficit: Secondary | ICD-10-CM | POA: Insufficient documentation

## 2022-03-31 NOTE — Therapy (Signed)
OUTPATIENT OCCUPATIONAL THERAPY NEURO EVALUATION  Patient Name: Terry Knox MRN: [id number] DOB:04-20-1941, 81 y.o., male Today's Date: 04/01/2022  PCP: Dr Doylene Canard REFERRING PROVIDER: Dr. Gershon Crane   OT End of Session - 04/01/22 0828     Visit Number 1    Number of Visits 1    Authorization Type [employer]    OT Start Time 8756    OT Stop Time 4332    OT Time Calculation (min) 55 min             Past Medical History:  Diagnosis Date   Hyperlipidemia    Hypertension    Stroke ([place]) 09/2021   Past Surgical History:  Procedure Laterality Date   CARDIAC CATHETERIZATION N/A 06/07/2015   Procedure: Left Heart Cath and Coronary Angiography;  Surgeon: Dixie Dials, MD;  Location: [facility];  Service: Cardiovascular;  Laterality: N/A;   ENUCLEATION Right    RUPTURED GLOBE EXPLORATION AND REPAIR Left    TOTAL HIP ARTHROPLASTY Right 12/02/2021   Procedure: TOTAL HIP ARTHROPLASTY;  Surgeon: Marchia Bond, MD;  Location: [facility];  Service: Orthopedics;  Laterality: Right;   Patient Active Problem List   Diagnosis Date Noted   S/P total right hip arthroplasty 12/03/2021   S/P hip replacement, right 12/02/2021   Osteoarthritis of right hip 11/18/2021   History of vitrectomy 10/29/2021   Adjustment disorder with disturbance of conduct 07/27/2020   Acute lacunar infarction [facility]) 07/26/2020   Macular hole, left eye 06/26/2020   Advanced nonexudative age-related macular degeneration of left eye with subfoveal involvement 06/26/2020   Primary open angle glaucoma of left eye, severe stage 06/26/2020   Blindness of one eye 06/26/2020   Pseudophakia, left eye 06/26/2020   Chest pain at rest 06/05/2015   TIA (transient ischemic attack) 03/16/2015    ONSET DATE: 01/20/22  REFERRING DIAG: glaucoma  THERAPY DIAG:  Visuospatial deficit - Plan: Ot plan of care cert/re-cert  Rationale for Evaluation and Treatment Rehabilitation  SUBJECTIVE:   SUBJECTIVE STATEMENT: What did they  say about the glaucoma Pt accompanied by: family member  PERTINENT HISTORY: see above, prosthetic eye R eye  PRECAUTIONS: Fall  WEIGHT BEARING RESTRICTIONS No  PAIN:  Are you having pain? No  FALLS: Has patient fallen in last 6 months? No  LIVING ENVIRONMENT: Lives with: lives with their family Lives in: House/apartment Stairs: Yes: External: PLOF: Independent with basic ADLs  PATIENT GOALS to see better  OBJECTIVE:   HAND DOMINANCE: Left  ADLs: Overall ADLs: modified independent Transfers/ambulation related to ADLs:   IADLs: Shopping: needs assist Light housekeeping: sweeps Meal Prep: sometimes cooks an Consulting civil engineer mobility: supervision Medication management: family assists with meds prn, pt fills pillbox with occasional assist Financial management: family handles   MOBILITY STATUS: Independent-supervision, due to viual deficits       COGNITION: Overall cognitive status: Within functional limits for tasks assessed, mini mental state 28/30- WFLS  VISION: Subjective report: blurry vision Baseline vision:  Pt states he has glasses but they do not help Visual history: glaucoma  VISION ASSESSMENT: Impaired Reading acuity: 20/250 Visual Fields: no vision in right eye, prosthesis  Patient has difficulty with following activities due to following visual impairments: reading    TODAY'S TREATMENT:  Pt and grand dtr were educated in the following: compensatory strategy for visual deficits,  5x stand and handheld magnifier use. Pt prefers 5x handheld magnifier with light for spot reading(He was provided with info for purchase. He returned demonstration following instruction. Pt  was also shown pebble mini electronic video magnifier and he returned demonstration. Therapist educated pt and grand dtr in use of hi marks for stove.   PATIENT EDUCATION: Education details: 5x stand and handheld magnifier use. Pt prefers 5x handheld magnifier with light for spot  reading(He was provided with info for purchase. He returned demonstration following instruction. Pt was also shown pebble mini electronic video magnifier and he returned demonstration. Therapist educated pt and grand dtr in use of hi marks for stove. Person educated: Patient and granddtr Education method: Explanation, Demonstration, Verbal cues, and Handouts Education comprehension: verbalized understanding, returned demonstration, and verbal cues required   HOME EXERCISE PROGRAM: N/a    GOALS: ASSESSMENT:  CLINICAL IMPRESSION: Patient is a 81 y.o. male who was seen today for occupational therapy evaluation for low vision eval due to glaucoma.   PERFORMANCE DEFICITS in functional skills including ADLs, IADLs, and vision, cognitive skills including  and psychosocial skills including coping strategies, environmental adaptation, habits, and routines and behaviors.   IMPAIRMENTS are limiting patient from ADLs, IADLs, play, leisure, and social participation.   COMORBIDITIES may have co-morbidities  that affects occupational performance. Patient will benefit from skilled OT to address above impairments and improve overall function.  MODIFICATION OR ASSISTANCE TO COMPLETE EVALUATION: No modification of tasks or assist necessary to complete an evaluation.  OT OCCUPATIONAL PROFILE AND HISTORY: Problem focused assessment: Including review of records relating to presenting problem.  CLINICAL DECISION MAKING: LOW - limited treatment options, no task modification necessary  REHAB POTENTIAL: Good  EVALUATION COMPLEXITY: Low    PLAN: OT FREQUENCY: one time visit plus eval  OT DURATION: 4 weeks  PLANNED INTERVENTIONS: self care/ADL training  RECOMMENDED OTHER SERVICES: n/a  CONSULTED AND AGREED WITH PLAN OF CARE: Patient and family member/caregiver  PLAN FOR NEXT SESSION: No additional visits recommended education was completed on day of eval. Pt plans to pursue 5x handheld magnifier  with a light.   Kyaira Trantham, OT 04/01/2022, 8:40 AM

## 2022-04-20 DIAGNOSIS — I2510 Atherosclerotic heart disease of native coronary artery without angina pectoris: Secondary | ICD-10-CM | POA: Diagnosis not present

## 2022-04-20 DIAGNOSIS — E440 Moderate protein-calorie malnutrition: Secondary | ICD-10-CM | POA: Diagnosis not present

## 2022-04-20 DIAGNOSIS — M25511 Pain in right shoulder: Secondary | ICD-10-CM | POA: Diagnosis not present

## 2022-04-20 DIAGNOSIS — I10 Essential (primary) hypertension: Secondary | ICD-10-CM | POA: Diagnosis not present

## 2022-06-22 DIAGNOSIS — M1611 Unilateral primary osteoarthritis, right hip: Secondary | ICD-10-CM | POA: Diagnosis not present

## 2022-07-23 DIAGNOSIS — H401123 Primary open-angle glaucoma, left eye, severe stage: Secondary | ICD-10-CM | POA: Diagnosis not present

## 2022-07-29 DIAGNOSIS — I10 Essential (primary) hypertension: Secondary | ICD-10-CM | POA: Diagnosis not present

## 2022-07-29 DIAGNOSIS — M25511 Pain in right shoulder: Secondary | ICD-10-CM | POA: Diagnosis not present

## 2022-07-29 DIAGNOSIS — I2510 Atherosclerotic heart disease of native coronary artery without angina pectoris: Secondary | ICD-10-CM | POA: Diagnosis not present

## 2022-07-29 DIAGNOSIS — E440 Moderate protein-calorie malnutrition: Secondary | ICD-10-CM | POA: Diagnosis not present

## 2022-10-19 ENCOUNTER — Encounter (HOSPITAL_COMMUNITY): Payer: Self-pay

## 2022-10-19 ENCOUNTER — Inpatient Hospital Stay (HOSPITAL_COMMUNITY)
Admission: AD | Admit: 2022-10-19 | Discharge: 2022-10-23 | DRG: 191 | Disposition: A | Discharge status: Home / Self Care | Payer: Medicare HMO | Attending: Cardiovascular Disease | Admitting: Cardiovascular Disease

## 2022-10-19 ENCOUNTER — Inpatient Hospital Stay (HOSPITAL_COMMUNITY): Payer: Medicare HMO

## 2022-10-19 DIAGNOSIS — R590 Localized enlarged lymph nodes: Secondary | ICD-10-CM | POA: Diagnosis not present

## 2022-10-19 DIAGNOSIS — I340 Nonrheumatic mitral (valve) insufficiency: Secondary | ICD-10-CM | POA: Diagnosis not present

## 2022-10-19 DIAGNOSIS — C3492 Malignant neoplasm of unspecified part of left bronchus or lung: Secondary | ICD-10-CM | POA: Diagnosis present

## 2022-10-19 DIAGNOSIS — J439 Emphysema, unspecified: Secondary | ICD-10-CM | POA: Diagnosis present

## 2022-10-19 DIAGNOSIS — I700 Atherosclerosis of aorta: Secondary | ICD-10-CM | POA: Diagnosis not present

## 2022-10-19 DIAGNOSIS — C3490 Malignant neoplasm of unspecified part of unspecified bronchus or lung: Secondary | ICD-10-CM | POA: Diagnosis not present

## 2022-10-19 DIAGNOSIS — R918 Other nonspecific abnormal finding of lung field: Secondary | ICD-10-CM | POA: Diagnosis not present

## 2022-10-19 DIAGNOSIS — E785 Hyperlipidemia, unspecified: Secondary | ICD-10-CM | POA: Diagnosis present

## 2022-10-19 DIAGNOSIS — I10 Essential (primary) hypertension: Secondary | ICD-10-CM | POA: Diagnosis present

## 2022-10-19 DIAGNOSIS — Z79899 Other long term (current) drug therapy: Secondary | ICD-10-CM | POA: Diagnosis not present

## 2022-10-19 DIAGNOSIS — Z7902 Long term (current) use of antithrombotics/antiplatelets: Secondary | ICD-10-CM

## 2022-10-19 DIAGNOSIS — I361 Nonrheumatic tricuspid (valve) insufficiency: Secondary | ICD-10-CM | POA: Diagnosis not present

## 2022-10-19 DIAGNOSIS — M1990 Unspecified osteoarthritis, unspecified site: Secondary | ICD-10-CM | POA: Diagnosis not present

## 2022-10-19 DIAGNOSIS — J209 Acute bronchitis, unspecified: Principal | ICD-10-CM | POA: Diagnosis present

## 2022-10-19 DIAGNOSIS — Z8673 Personal history of transient ischemic attack (TIA), and cerebral infarction without residual deficits: Secondary | ICD-10-CM

## 2022-10-19 DIAGNOSIS — F17200 Nicotine dependence, unspecified, uncomplicated: Secondary | ICD-10-CM | POA: Diagnosis not present

## 2022-10-19 DIAGNOSIS — Z8249 Family history of ischemic heart disease and other diseases of the circulatory system: Secondary | ICD-10-CM | POA: Diagnosis not present

## 2022-10-19 DIAGNOSIS — C779 Secondary and unspecified malignant neoplasm of lymph node, unspecified: Secondary | ICD-10-CM | POA: Diagnosis not present

## 2022-10-19 DIAGNOSIS — J9809 Other diseases of bronchus, not elsewhere classified: Secondary | ICD-10-CM | POA: Diagnosis not present

## 2022-10-19 DIAGNOSIS — J440 Chronic obstructive pulmonary disease with acute lower respiratory infection: Secondary | ICD-10-CM | POA: Diagnosis present

## 2022-10-19 DIAGNOSIS — Z1152 Encounter for screening for COVID-19: Secondary | ICD-10-CM | POA: Diagnosis not present

## 2022-10-19 DIAGNOSIS — F17210 Nicotine dependence, cigarettes, uncomplicated: Secondary | ICD-10-CM | POA: Diagnosis not present

## 2022-10-19 DIAGNOSIS — J441 Chronic obstructive pulmonary disease with (acute) exacerbation: Secondary | ICD-10-CM | POA: Diagnosis not present

## 2022-10-19 DIAGNOSIS — J9811 Atelectasis: Secondary | ICD-10-CM | POA: Diagnosis not present

## 2022-10-19 DIAGNOSIS — Z96641 Presence of right artificial hip joint: Secondary | ICD-10-CM | POA: Diagnosis present

## 2022-10-19 DIAGNOSIS — R0602 Shortness of breath: Secondary | ICD-10-CM | POA: Diagnosis not present

## 2022-10-19 DIAGNOSIS — R072 Precordial pain: Secondary | ICD-10-CM | POA: Diagnosis not present

## 2022-10-19 LAB — CBC WITH DIFFERENTIAL/PLATELET
Abs Immature Granulocytes: 0.02 10*3/uL (ref 0.00–0.07)
Basophils Absolute: 0 10*3/uL (ref 0.0–0.1)
Basophils Relative: 1 %
Eosinophils Absolute: 0.2 10*3/uL (ref 0.0–0.5)
Eosinophils Relative: 4 %
HCT: 33.3 % — ABNORMAL LOW (ref 39.0–52.0)
Hemoglobin: 11.5 g/dL — ABNORMAL LOW (ref 13.0–17.0)
Immature Granulocytes: 0 %
Lymphocytes Relative: 29 %
Lymphs Abs: 1.7 10*3/uL (ref 0.7–4.0)
MCH: 32.5 pg (ref 26.0–34.0)
MCHC: 34.5 g/dL (ref 30.0–36.0)
MCV: 94.1 fL (ref 80.0–100.0)
Monocytes Absolute: 0.4 10*3/uL (ref 0.1–1.0)
Monocytes Relative: 7 %
Neutro Abs: 3.5 10*3/uL (ref 1.7–7.7)
Neutrophils Relative %: 59 %
Platelets: 456 10*3/uL — ABNORMAL HIGH (ref 150–400)
RBC: 3.54 MIL/uL — ABNORMAL LOW (ref 4.22–5.81)
RDW: 12.3 % (ref 11.5–15.5)
WBC: 5.9 10*3/uL (ref 4.0–10.5)
nRBC: 0 % (ref 0.0–0.2)

## 2022-10-19 LAB — COMPREHENSIVE METABOLIC PANEL
ALT: 13 U/L (ref 0–44)
AST: 25 U/L (ref 15–41)
Albumin: 2.6 g/dL — ABNORMAL LOW (ref 3.5–5.0)
Alkaline Phosphatase: 50 U/L (ref 38–126)
Anion gap: 11 (ref 5–15)
BUN: 13 mg/dL (ref 8–23)
CO2: 26 mmol/L (ref 22–32)
Calcium: 9.1 mg/dL (ref 8.9–10.3)
Chloride: 98 mmol/L (ref 98–111)
Creatinine, Ser: 0.75 mg/dL (ref 0.61–1.24)
GFR, Estimated: >60 mL/min (ref >60)
Glucose, Bld: 99 mg/dL (ref 70–99)
Potassium: 4.2 mmol/L (ref 3.5–5.1)
Sodium: 135 mmol/L (ref 135–145)
Total Bilirubin: 0.5 mg/dL (ref 0.3–1.2)
Total Protein: 6.5 g/dL (ref 6.5–8.1)

## 2022-10-19 LAB — GLUCOSE, CAPILLARY
Glucose-Capillary: 105 mg/dL — ABNORMAL HIGH (ref 70–99)
Glucose-Capillary: 84 mg/dL (ref 70–99)

## 2022-10-19 MED ORDER — GUAIFENESIN ER 600 MG PO TB12
600.0000 mg | ORAL_TABLET | Freq: Two times a day (BID) | ORAL | Status: DC
  Administered 2022-10-19 – 2022-10-23 (×7): 600 mg via ORAL
  Filled 2022-10-19 (×7): qty 1

## 2022-10-19 MED ORDER — SODIUM CHLORIDE 0.9 % IV SOLN
500.0000 mg | INTRAVENOUS | Status: AC
  Administered 2022-10-19: 500 mg via INTRAVENOUS
  Filled 2022-10-19 (×2): qty 5

## 2022-10-19 MED ORDER — FLUTICASONE FUROATE-VILANTEROL 100-25 MCG/ACT IN AEPB
1.0000 | INHALATION_SPRAY | Freq: Every day | RESPIRATORY_TRACT | Status: DC
  Administered 2022-10-19 – 2022-10-23 (×5): 1 via RESPIRATORY_TRACT
  Filled 2022-10-19: qty 28

## 2022-10-19 MED ORDER — NICOTINE 21 MG/24HR TD PT24
21.0000 mg | MEDICATED_PATCH | Freq: Every day | TRANSDERMAL | Status: DC
  Administered 2022-10-19 – 2022-10-22 (×3): 21 mg via TRANSDERMAL
  Filled 2022-10-19 (×3): qty 1

## 2022-10-19 MED ORDER — AZITHROMYCIN 250 MG PO TABS
500.0000 mg | ORAL_TABLET | Freq: Every day | ORAL | Status: DC
  Administered 2022-10-20 – 2022-10-23 (×3): 500 mg via ORAL
  Filled 2022-10-19 (×4): qty 2

## 2022-10-19 MED ORDER — ALBUTEROL SULFATE (2.5 MG/3ML) 0.083% IN NEBU: 2.5000 mg | INHALATION_SOLUTION | RESPIRATORY_TRACT | Status: DC | PRN

## 2022-10-19 MED ORDER — HEPARIN SODIUM (PORCINE) 5000 UNIT/ML IJ SOLN
5000.0000 [IU] | Freq: Three times a day (TID) | INTRAMUSCULAR | Status: DC
  Administered 2022-10-19 – 2022-10-21 (×6): 5000 [IU] via SUBCUTANEOUS
  Filled 2022-10-19 (×6): qty 1

## 2022-10-19 MED ORDER — SODIUM CHLORIDE 0.9 % IV SOLN: INTRAVENOUS | Status: DC

## 2022-10-19 NOTE — Progress Notes (Signed)
Dinner tray ordered.

## 2022-10-19 NOTE — H&P (Signed)
Referring Physician:   BRADEY LUZIER is an 82 y.o. male.                       Chief Complaint: Cough and shortness of breath  HPI: 82 years old black male with PMH of HTN, HLD, stroke, COPD and long standing history of smoking cigarettes has cough with thick sputum and shortness of breath worsening over 2 weeks periods. His oxygen saturation was 89 % in office.  Past Medical History:  Diagnosis Date   Hyperlipidemia    Hypertension    Stroke [facility]) 09/2021      Past Surgical History:  Procedure Laterality Date   CARDIAC CATHETERIZATION N/A 06/07/2015   Procedure: Left Heart Cath and Coronary Angiography;  Surgeon: Dixie Dials, MD;  Location: [place] [facility];  Service: Cardiovascular;  Laterality: N/A;   ENUCLEATION Right    RUPTURED GLOBE EXPLORATION AND REPAIR Left    TOTAL HIP ARTHROPLASTY Right 12/02/2021   Procedure: TOTAL HIP ARTHROPLASTY;  Surgeon: Marchia Bond, MD;  Location: [facility];  Service: Orthopedics;  Laterality: Right;    Family History  Problem Relation Age of Onset   Hypertension Mother    Social History:  reports that he has been smoking cigarettes. He has a 60.00 pack-year smoking history. He has never used smokeless tobacco. He reports that he does not drink alcohol and does not use drugs.  Allergies: No Known Allergies  Medications Prior to Admission  Medication Sig Dispense Refill   albuterol (VENTOLIN HFA) 108 (90 Base) MCG/ACT inhaler Inhale into the lungs.     amLODipine (NORVASC) 2.5 MG tablet Take 1 tablet (2.5 mg total) by mouth daily. 30 tablet 1   aspirin EC 325 MG tablet Take 1 tablet (325 mg total) by mouth 2 (two) times daily. For 30 days after surgery 60 tablet 0   atorvastatin (LIPITOR) 40 MG tablet Take 40 mg by mouth at bedtime.     clopidogrel (PLAVIX) 75 MG tablet Take 75 mg by mouth daily.     HYDROcodone-acetaminophen (NORCO) 10-325 MG tablet Take 1 tablet by mouth every 4 (four) hours as needed. 30 tablet 0   lisinopril (ZESTRIL) 10  MG tablet Take 10 mg by mouth daily.     ondansetron (ZOFRAN) 4 MG tablet Take 1 tablet (4 mg total) by mouth every 8 (eight) hours as needed for nausea or vomiting. 10 tablet 0   potassium chloride (KLOR-CON) 10 MEQ tablet Take 10 mEq by mouth daily.     sennosides-docusate sodium (SENOKOT-S) 8.6-50 MG tablet Take 2 tablets by mouth daily. 30 tablet 1   timolol (TIMOPTIC-XR) 0.5 % ophthalmic gel-forming Place 1 drop into the left eye 2 (two) times daily.  4    Results for orders placed or performed during the hospital encounter of 10/19/22 (from the past 48 hour(s))  Glucose, capillary     Status: Abnormal   Collection Time: 10/19/22  2:45 PM  Result Value Ref Range   Glucose-Capillary 105 (H) 70 - 99 mg/dL    Comment: Glucose reference range applies only to samples taken after fasting for at least 8 hours.   Comment 1 Notify RN    No results found.  Review Of Systems Constitutional: No fever, chills, weight loss or gain. Eyes: No vision change, wears glasses. No discharge or pain. Ears: No hearing loss, No tinnitus. Respiratory: No asthma, positive COPD, pneumonias, shortness of breath. No hemoptysis. Cardiovascular: Positive chest pain, palpitation,no  leg edema.  Gastrointestinal: No nausea, vomiting, diarrhea, constipation. No GI bleed. No hepatitis. Genitourinary: No dysuria, hematuria, kidney stone. No incontinance. Neurological: No headache, stroke, seizures.  Psychiatry: No psych facility admission for anxiety, depression, suicide. No detox. Skin: No rash. Musculoskeletal: Positive joint pain, no fibromyalgia. No neck pain, back pain. Lymphadenopathy: No lymphadenopathy. Hematology: No anemia or easy bruising.   Blood pressure 121/62, pulse 62, temperature 98.5 F (36.9 C), temperature source Oral, resp. rate 16, SpO2 95 %. There is no height or weight on file to calculate BMI. General appearance: alert, cooperative, appears stated age and mild respiratory distress Head:  Normocephalic, atraumatic. Eyes: Brown eyes, pink conjunctiva, corneas clear. Artificial right eye. Neck: No adenopathy, no carotid bruit, no JVD, supple, symmetrical, trachea midline and thyroid not enlarged. Resp: Rhonchi to auscultation bilaterally. Cardio: Regular rate and rhythm, S1, S2 normal, II/VI systolic murmur, no click, rub or gallop GI: Soft, non-tender; bowel sounds normal; no organomegaly. Extremities: No edema, cyanosis or clubbing. Skin: Warm and dry.  Neurologic: Alert and oriented X 3, normal strength. Normal coordination and slow gait.  Assessment/Plan Acute bronchitis COPD, exacerbation HTN HLD Tobacco use disorder Old stroke  Plan: Admit CXR to r/o pneumonia Short acting bronchodilator as needed. Supplemental oxygen. IV antibiotics if CXR is positive for pneumonia  Time spent: Review of old records, Lab, x-rays, EKG, other cardiac tests, examination, discussion with patient/Nurse over 70 minutes.  Birdie Riddle, MD  10/19/2022, 3:37 PM

## 2022-10-19 NOTE — Progress Notes (Signed)
Pt arrived to [place] room 30 alert and oriented. Pain level 3/10. Bed in lowest position, call light in reach. Will continue to monitor pt. Kadakia,MD made aware of patients arrival to the floor. Awaiting new orders

## 2022-10-20 ENCOUNTER — Inpatient Hospital Stay (HOSPITAL_COMMUNITY): Payer: Medicare HMO

## 2022-10-20 DIAGNOSIS — R918 Other nonspecific abnormal finding of lung field: Secondary | ICD-10-CM | POA: Diagnosis not present

## 2022-10-20 LAB — RESPIRATORY PANEL BY PCR

## 2022-10-20 LAB — BASIC METABOLIC PANEL
Anion gap: 10 (ref 5–15)
BUN: 12 mg/dL (ref 8–23)
CO2: 25 mmol/L (ref 22–32)
Calcium: 9 mg/dL (ref 8.9–10.3)
Chloride: 101 mmol/L (ref 98–111)
Creatinine, Ser: 0.84 mg/dL (ref 0.61–1.24)
GFR, Estimated: >60 mL/min (ref >60)
Glucose, Bld: 99 mg/dL (ref 70–99)
Potassium: 4.1 mmol/L (ref 3.5–5.1)
Sodium: 136 mmol/L (ref 135–145)

## 2022-10-20 LAB — CBC
HCT: 33.5 % — ABNORMAL LOW (ref 39.0–52.0)
Hemoglobin: 11.3 g/dL — ABNORMAL LOW (ref 13.0–17.0)
MCH: 31.8 pg (ref 26.0–34.0)
MCHC: 33.7 g/dL (ref 30.0–36.0)
MCV: 94.4 fL (ref 80.0–100.0)
Platelets: 422 10*3/uL — ABNORMAL HIGH (ref 150–400)
RBC: 3.55 MIL/uL — ABNORMAL LOW (ref 4.22–5.81)
RDW: 12.1 % (ref 11.5–15.5)
WBC: 6.8 10*3/uL (ref 4.0–10.5)
nRBC: 0 % (ref 0.0–0.2)

## 2022-10-20 LAB — ECHOCARDIOGRAM COMPLETE

## 2022-10-20 MED ORDER — IOHEXOL 350 MG/ML SOLN
50.0000 mL | Freq: Once | INTRAVENOUS | Status: AC | PRN
  Administered 2022-10-20: 50 mL via INTRAVENOUS

## 2022-10-20 MED ORDER — PREDNISONE 20 MG PO TABS
40.0000 mg | ORAL_TABLET | Freq: Every day | ORAL | Status: DC
  Administered 2022-10-20 – 2022-10-23 (×2): 40 mg via ORAL
  Filled 2022-10-20 (×2): qty 2

## 2022-10-20 NOTE — Progress Notes (Signed)
radiology called and stated that chest xray that was done yesterday showed a round mass in his left lower lobe that shows a mestatic disease so the patient will need a CT of chest with IV contrast. Kadakia,MD made aware.

## 2022-10-20 NOTE — Progress Notes (Signed)
   10/20/22 1628  Mobility  Activity Ambulated independently in hallway  Level of Assistance Independent  Assistive Device None  Distance Ambulated (ft) 450 ft  Activity Response Tolerated well  Mobility Referral Yes  $Mobility charge 1 Mobility   Mobility Specialist Progress Note  Pt in bed and agreeable. Had no c/o pain throughout ambulation. Returned to bed w/ all needs met and call bell in reach.   Lucious Groves Mobility Specialist  Please contact via SecureChat or Rehab office at [phone]

## 2022-10-20 NOTE — Progress Notes (Signed)
Patient returned back to [place] room 30 alert and oriented. Bed in lowest position, call light in reach. Meal tray setup. All needs met at this time. Will continue to monitor pt.

## 2022-10-20 NOTE — Consult Note (Signed)
NAME:  Terry Knox, MRN:  [id number], DOB:  1941-07-27, LOS: 1 ADMISSION DATE:  10/19/2022, CONSULTATION DATE:  10/20/22 REFERRING MD:  Doylene Canard, CHIEF COMPLAINT:  dyspnea, cough   History of Present Illness:  82yM with history of HTN, stroke, COPD, smoking who presented with 2 weeks worsening dyspnea. Had O2 saturation in high 80s in office on RA. Here he ha sbeen started on bronchodilators, azithromycin for possible AECOPD. He was found incidentally to have large LLL mass, multiple nodules, enlarged mediastinal and hilar lymph nodes concerning for metastatic lung cancer.   Pertinent  Medical History  HTN Stroke COPD Smoking   Significant Hospital Events: Including procedures, antibiotic start and stop dates in addition to other pertinent events   10/19/22 admitted started on bronchodilators, azithromycin  Interim History / Subjective:    Objective   Blood pressure 137/75, pulse 67, temperature 98.8 F (37.1 C), temperature source Oral, resp. rate 18, SpO2 95 %.        Intake/Output Summary (Last 24 hours) at 10/20/2022 1629 Last data filed at 10/20/2022 1505 Gross per 24 hour  Intake 1008.56 ml  Output 900 ml  Net 108.56 ml   There were no vitals filed for this visit.  Examination: General appearance: 82 y.o., male, thin, chronically ill appearing HENT: NCAT; MMM Neck: Trachea midline; no lymphadenopathy, no JVD Lungs: faint wheeze bl upper lung fields, with mildly increased respiratory effort CV: RRR, no murmur  Abdomen: Soft, non-tender; non-distended, BS present  Extremities: No peripheral edema, warm Skin: Normal turgor and texture; no rash Neuro: grossly nonfocal   Resolved Hospital Problem list     Assessment & Plan:   Dyspnea Acute exacerbation of COPD LLL mass, bilateral solid nodules, mediastinal and hilar LAD - continue azithro 5d course - prednisone 40 mg for 5 days - continue current bronchodilators - walking oximetry prior to discharge - EBUS under  general we can arrange on Thursday either as outpatient if he is discharged or as inpatient if he remains here   Best Practice (right click and "Reselect all SmartList Selections" daily)   Per primary  Labs   CBC: Recent Labs  Lab 10/19/22 1555 10/20/22 0305  WBC 5.9 6.8  NEUTROABS 3.5  --   HGB 11.5* 11.3*  HCT 33.3* 33.5*  MCV 94.1 94.4  PLT 456* 422*    Basic Metabolic Panel: Recent Labs  Lab 10/19/22 1555 10/20/22 0305  NA 135 136  K 4.2 4.1  CL 98 101  CO2 26 25  GLUCOSE 99 99  BUN 13 12  CREATININE 0.75 0.84  CALCIUM 9.1 9.0   GFR: CrCl cannot be calculated (Unknown ideal weight.). Recent Labs  Lab 10/19/22 1555 10/20/22 0305  WBC 5.9 6.8    Liver Function Tests: Recent Labs  Lab 10/19/22 1555  AST 25  ALT 13  ALKPHOS 50  BILITOT 0.5  PROT 6.5  ALBUMIN 2.6*   No results for input(s): "LIPASE", "AMYLASE" in the last 168 hours. No results for input(s): "AMMONIA" in the last 168 hours.  ABG No results found for: "PHART", "PCO2ART", "PO2ART", "HCO3", "TCO2", "ACIDBASEDEF", "O2SAT"   Coagulation Profile: No results for input(s): "INR", "PROTIME" in the last 168 hours.  Cardiac Enzymes: No results for input(s): "CKTOTAL", "CKMB", "CKMBINDEX", "TROPONINI" in the last 168 hours.  HbA1C: Hgb A1c MFr Bld  Date/Time Value Ref Range Status  07/26/2020 08:20 AM 5.8 (H) 4.8 - 5.6 % Final    Comment:    (NOTE) Pre diabetes:  5.7%-6.4%  Diabetes:              >6.4%  Glycemic control for   <7.0% adults with diabetes   03/16/2015 03:56 AM 6.1 (H) 4.0 - 6.0 % Final    CBG: Recent Labs  Lab 10/19/22 1445 10/19/22 1706  GLUCAP 105* 84    Review of Systems:   12 point review of systems is negative except as in hpi  Past Medical History:  He,  has a past medical history of Hyperlipidemia, Hypertension, and Stroke ([place]) (09/2021).   Surgical History:   Past Surgical History:  Procedure Laterality Date   CARDIAC CATHETERIZATION  N/A 06/07/2015   Procedure: Left Heart Cath and Coronary Angiography;  Surgeon: Dixie Dials, MD;  Location: [place] [facility];  Service: Cardiovascular;  Laterality: N/A;   ENUCLEATION Right    RUPTURED GLOBE EXPLORATION AND REPAIR Left    TOTAL HIP ARTHROPLASTY Right 12/02/2021   Procedure: TOTAL HIP ARTHROPLASTY;  Surgeon: Marchia Bond, MD;  Location: [facility];  Service: Orthopedics;  Laterality: Right;     Social History:   reports that he has been smoking cigarettes. He has a 60.00 pack-year smoking history. He has never used smokeless tobacco. He reports that he does not drink alcohol and does not use drugs.   Family History:  His family history includes Hypertension in his mother.   Allergies No Known Allergies   Home Medications  Prior to Admission medications   Medication Sig Start Date End Date Taking? Authorizing Provider  acetaminophen (TYLENOL) 500 MG tablet Take 1,000 mg by mouth daily as needed for mild pain, moderate pain, fever or headache.   Yes [provider]  albuterol (VENTOLIN HFA) 108 (90 Base) MCG/ACT inhaler Inhale 2 puffs into the lungs every 6 (six) hours as needed for wheezing or shortness of breath. 07/18/20  Yes [provider]  amLODipine (NORVASC) 2.5 MG tablet Take 1 tablet (2.5 mg total) by mouth daily. 07/31/20  Yes Dixie Dials, MD  aspirin EC 81 MG tablet Take 81 mg by mouth daily.   Yes [provider]  atorvastatin (LIPITOR) 40 MG tablet Take 40 mg by mouth at bedtime. 05/16/20  Yes [provider]  clopidogrel (PLAVIX) 75 MG tablet Take 75 mg by mouth daily.   Yes [provider]  hydrALAZINE (APRESOLINE) 10 MG tablet Take 10 mg by mouth 2 (two) times daily.   Yes [provider]  lisinopril (ZESTRIL) 10 MG tablet Take 10 mg by mouth daily. 07/04/20  Yes [provider]  Multiple Vitamins-Minerals (CENTRUM SILVER 50+MEN) TABS Take 1 tablet by mouth at bedtime.   Yes [provider]  potassium chloride (KLOR-CON) 10 MEQ tablet Take 10 mEq by mouth daily. 09/17/21  Yes [provider]  timolol (BETIMOL) 0.5 % ophthalmic solution Place 1 drop into the left eye 2 (two) times daily.   Yes [provider]     Critical care time: na

## 2022-10-20 NOTE — Progress Notes (Signed)
Patient transported to CT via bed by transportation staff

## 2022-10-20 NOTE — Progress Notes (Signed)
Ref: Dixie Dials, MD   Subjective:  Awake. Eating breakfast. VS stable. Chest x-ray findings discussed with patient. He will undergo CT chest with contrast. Pulmonary consult sought.  Objective:  Vital Signs in the last 24 hours: Temp:  [98.8 F (37.1 C)-99.5 F (37.5 C)] 98.8 F (37.1 C) (02/06 1658) Pulse Rate:  [67-80] 67 (02/06 1658) Resp:  [17-20] 17 (02/06 1658) BP: (109-179)/(55-77) 109/55 (02/06 1658) SpO2:  [93 %-97 %] 93 % (02/06 1658)  Physical Exam: BP Readings from Last 1 Encounters:  10/20/22 (!) 109/55     Wt Readings from Last 1 Encounters:  12/02/21 58.5 kg    Weight change:  There is no height or weight on file to calculate BMI. HEENT: [place]/AT, Eyes-Brown, Conjunctiva-Pink, Sclera-Non-icteric Neck: No JVD, No bruit, Trachea midline. Lungs:  Clearing, Bilateral. Cardiac:  Regular rhythm, normal S1 and S2, no S3. II/VI systolic murmur. Abdomen:  Soft, non-tender. BS present. Extremities:  No edema present. No cyanosis. No clubbing. CNS: AxOx3, Cranial nerves grossly intact, moves all 4 extremities.  Skin: Warm and dry.   Intake/Output from previous day: 02/05 0701 - 02/06 0700 In: 595.9 [I.V.:584.6; IV Piggyback:11.3] Out: 500 [Urine:500]    Lab Results: BMET    Component Value Date/Time   NA 136 10/20/2022 0305   NA 135 10/19/2022 1555   NA 133 (L) 12/03/2021 0326   K 4.1 10/20/2022 0305   K 4.2 10/19/2022 1555   K 4.7 12/03/2021 0326   CL 101 10/20/2022 0305   CL 98 10/19/2022 1555   CL 101 12/03/2021 0326   CO2 25 10/20/2022 0305   CO2 26 10/19/2022 1555   CO2 27 12/03/2021 0326   GLUCOSE 99 10/20/2022 0305   GLUCOSE 99 10/19/2022 1555   GLUCOSE 152 (H) 12/03/2021 0326   BUN 12 10/20/2022 0305   BUN 13 10/19/2022 1555   BUN 22 12/03/2021 0326   CREATININE 0.84 10/20/2022 0305   CREATININE 0.75 10/19/2022 1555   CREATININE 0.84 12/03/2021 0326   CALCIUM 9.0 10/20/2022 0305   CALCIUM 9.1 10/19/2022 1555   CALCIUM 8.7 (L)  12/03/2021 0326   GFRNONAA >60 10/20/2022 0305   GFRNONAA >60 10/19/2022 1555   GFRNONAA >60 12/03/2021 0326   GFRAA >60 03/17/2019 0948   GFRAA >60 06/08/2015 0346   GFRAA >60 06/06/2015 0649   CBC    Component Value Date/Time   WBC 6.8 10/20/2022 0305   RBC 3.55 (L) 10/20/2022 0305   HGB 11.3 (L) 10/20/2022 0305   HCT 33.5 (L) 10/20/2022 0305   PLT 422 (H) 10/20/2022 0305   MCV 94.4 10/20/2022 0305   MCH 31.8 10/20/2022 0305   MCHC 33.7 10/20/2022 0305   RDW 12.1 10/20/2022 0305   LYMPHSABS 1.7 10/19/2022 1555   MONOABS 0.4 10/19/2022 1555   EOSABS 0.2 10/19/2022 1555   BASOSABS 0.0 10/19/2022 1555   HEPATIC Function Panel Recent Labs    10/19/22 1555  PROT 6.5  ALBUMIN 2.6*  AST 25  ALT 13  ALKPHOS 50   HEMOGLOBIN A1C Lab Results  Component Value Date   MPG 119.76 07/26/2020   CARDIAC ENZYMES Lab Results  Component Value Date   TROPONINI <0.03 06/06/2015   TROPONINI <0.03 06/06/2015   TROPONINI <0.03 06/05/2015   BNP No results for input(s): "PROBNP" in the last 8760 hours. TSH No results for input(s): "TSH" in the last 8760 hours. CHOLESTEROL No results for input(s): "CHOL" in the last 8760 hours.  Scheduled Meds:  azithromycin  500 mg  Oral Daily   fluticasone furoate-vilanterol  1 puff Inhalation Daily   guaiFENesin  600 mg Oral BID   heparin  5,000 Units Subcutaneous Q8H   nicotine  21 mg Transdermal Daily   Continuous Infusions:  sodium chloride 50 mL/hr at 10/20/22 0606   PRN Meds:.albuterol  Assessment/Plan: Acute bronchitis COPD exacerbation Left lung mass with possible mets Chronic tobacco use disorder HTN HLD Old stroke  Plan: CT chest with contrast. Pulmonary consult. Poosible Oncology consult post tissue diagnosis   LOS: 1 day   Time spent including chart review, lab review, examination, discussion with patient/Nurse/Doctor : 30 min   Dixie Dials  MD  10/20/2022, 5:30 PM

## 2022-10-20 NOTE — TOC Initial Note (Signed)
Transition of Care [facility]) - Initial/Assessment Note    Patient Details  Name: Terry Knox MRN: [id number] Date of Birth: [birth year]  Transition of [facility]) CM/SW Contact:    Terry Favre, RN Phone Number: 10/20/2022, 3:04 PM  Clinical Narrative:                 Spoke to patient at bedside. Patient from home with daughter Terry Knox.   Patient independent does not use any DME.   PCP DR Doylene Canard   Has transportation to appointments and can get prescriptions filled       Transition of Care Department [facility]) has reviewed patient and no TOC needs have been identified at this time. We will continue to monitor patient advancement through interdisciplinary progression rounds. If new patient transition needs arise, please place a TOC consult.    Expected Discharge Plan: Home/Self Care Barriers to Discharge: Continued Medical Work up   Patient Goals and CMS Choice Patient states their goals for this hospitalization and ongoing recovery are:: to return to home          Expected Discharge Plan and Services   Discharge Planning Services: CM Consult   Living arrangements for the past 2 months: Single Family Home                 DME Arranged: N/A         HH Arranged: NA          Prior Living Arrangements/Services Living arrangements for the past 2 months: Single Family Home Lives with:: Adult Children Patient language and need for interpreter reviewed:: Yes Do you feel safe going back to the place where you live?: Yes      Need for Family Participation in Patient Care: Yes (Comment) Care giver support system in place?: Yes (comment)   Criminal Activity/Legal Involvement Pertinent to Current Situation/Hospitalization: No - Comment as needed  Activities of Daily Living      Permission Sought/Granted   Permission granted to share information with : No              Emotional Assessment Appearance:: Appears stated age Attitude/Demeanor/Rapport: Engaged Affect  (typically observed): Accepting Orientation: : Oriented to Self, Oriented to Place, Oriented to  Time, Oriented to Situation Alcohol / Substance Use: Not Applicable Psych Involvement: No (comment)  Admission diagnosis:  Acute bronchitis [J20.9] Patient Active Problem List   Diagnosis Date Noted   Acute bronchitis 10/19/2022   S/P total right hip arthroplasty 12/03/2021   S/P hip replacement, right 12/02/2021   Osteoarthritis of right hip 11/18/2021   History of vitrectomy 10/29/2021   Adjustment disorder with disturbance of conduct 07/27/2020   Acute lacunar infarction ([place]) 07/26/2020   Macular hole, left eye 06/26/2020   Advanced nonexudative age-related macular degeneration of left eye with subfoveal involvement 06/26/2020   Primary open angle glaucoma of left eye, severe stage 06/26/2020   Blindness of one eye 06/26/2020   Pseudophakia, left eye 06/26/2020   Chest pain at rest 06/05/2015   TIA (transient ischemic attack) 03/16/2015   PCP:  Terry Dials, MD Pharmacy:   [id number] - [place], [address] [place] [address] Phone: [phone] Fax: [phone]     Social Determinants of Health ([place]) Social History: SDOH Screenings   Tobacco Use: High Risk (12/03/2021)   SDOH Interventions:     Readmission Risk Interventions     No data to display

## 2022-10-20 NOTE — Progress Notes (Signed)
Nurse requested Mobility Specialist to perform oxygen saturation test with pt which includes removing pt from oxygen both at rest and while ambulating.  Below are the results from that testing.     Patient Saturations on Room Air at Rest = spO2 97%  Patient Saturations on Room Air while Ambulating = sp02 90% .  Rested and performed pursed lip breathing for 1 minute with sp02 at 93%.  Patient Saturations on 0 Liters of oxygen while Ambulating = sp02 93%  At end of testing pt left in room on 0  Liters of oxygen.  Reported results to nurse.

## 2022-10-20 NOTE — H&P (View-Only) (Signed)
NAME:  Terry Knox, MRN:  [id number], DOB:  1941/01/16, LOS: 1 ADMISSION DATE:  10/19/2022, CONSULTATION DATE:  10/20/22 REFERRING MD:  Doylene Canard, CHIEF COMPLAINT:  dyspnea, cough   History of Present Illness:  82yM with history of HTN, stroke, COPD, smoking who presented with 2 weeks worsening dyspnea. Had O2 saturation in high 80s in office on RA. Here he ha sbeen started on bronchodilators, azithromycin for possible AECOPD. He was found incidentally to have large LLL mass, multiple nodules, enlarged mediastinal and hilar lymph nodes concerning for metastatic lung cancer.   Pertinent  Medical History  HTN Stroke COPD Smoking   Significant Hospital Events: Including procedures, antibiotic start and stop dates in addition to other pertinent events   10/19/22 admitted started on bronchodilators, azithromycin  Interim History / Subjective:    Objective   Blood pressure 137/75, pulse 67, temperature 98.8 F (37.1 C), temperature source Oral, resp. rate 18, SpO2 95 %.        Intake/Output Summary (Last 24 hours) at 10/20/2022 1629 Last data filed at 10/20/2022 1505 Gross per 24 hour  Intake 1008.56 ml  Output 900 ml  Net 108.56 ml   There were no vitals filed for this visit.  Examination: General appearance: 82 y.o., male, thin, chronically ill appearing HENT: NCAT; MMM Neck: Trachea midline; no lymphadenopathy, no JVD Lungs: faint wheeze bl upper lung fields, with mildly increased respiratory effort CV: RRR, no murmur  Abdomen: Soft, non-tender; non-distended, BS present  Extremities: No peripheral edema, warm Skin: Normal turgor and texture; no rash Neuro: grossly nonfocal   Resolved Hospital Problem list     Assessment & Plan:   Dyspnea Acute exacerbation of COPD LLL mass, bilateral solid nodules, mediastinal and hilar LAD - continue azithro 5d course - prednisone 40 mg for 5 days - continue current bronchodilators - walking oximetry prior to discharge - EBUS under  general we can arrange on Thursday either as outpatient if he is discharged or as inpatient if he remains here   Best Practice (right click and "Reselect all SmartList Selections" daily)   Per primary  Labs   CBC: Recent Labs  Lab 10/19/22 1555 10/20/22 0305  WBC 5.9 6.8  NEUTROABS 3.5  --   HGB 11.5* 11.3*  HCT 33.3* 33.5*  MCV 94.1 94.4  PLT 456* 422*    Basic Metabolic Panel: Recent Labs  Lab 10/19/22 1555 10/20/22 0305  NA 135 136  K 4.2 4.1  CL 98 101  CO2 26 25  GLUCOSE 99 99  BUN 13 12  CREATININE 0.75 0.84  CALCIUM 9.1 9.0   GFR: CrCl cannot be calculated (Unknown ideal weight.). Recent Labs  Lab 10/19/22 1555 10/20/22 0305  WBC 5.9 6.8    Liver Function Tests: Recent Labs  Lab 10/19/22 1555  AST 25  ALT 13  ALKPHOS 50  BILITOT 0.5  PROT 6.5  ALBUMIN 2.6*   No results for input(s): "LIPASE", "AMYLASE" in the last 168 hours. No results for input(s): "AMMONIA" in the last 168 hours.  ABG No results found for: "PHART", "PCO2ART", "PO2ART", "HCO3", "TCO2", "ACIDBASEDEF", "O2SAT"   Coagulation Profile: No results for input(s): "INR", "PROTIME" in the last 168 hours.  Cardiac Enzymes: No results for input(s): "CKTOTAL", "CKMB", "CKMBINDEX", "TROPONINI" in the last 168 hours.  HbA1C: Hgb A1c MFr Bld  Date/Time Value Ref Range Status  07/26/2020 08:20 AM 5.8 (H) 4.8 - 5.6 % Final    Comment:    (NOTE) Pre diabetes:  5.7%-6.4%  Diabetes:              >6.4%  Glycemic control for   <7.0% adults with diabetes   03/16/2015 03:56 AM 6.1 (H) 4.0 - 6.0 % Final    CBG: Recent Labs  Lab 10/19/22 1445 10/19/22 1706  GLUCAP 105* 84    Review of Systems:   12 point review of systems is negative except as in hpi  Past Medical History:  He,  has a past medical history of Hyperlipidemia, Hypertension, and Stroke ([place]) (09/2021).   Surgical History:   Past Surgical History:  Procedure Laterality Date   CARDIAC CATHETERIZATION  N/A 06/07/2015   Procedure: Left Heart Cath and Coronary Angiography;  Surgeon: Dixie Dials, MD;  Location: [facility];  Service: Cardiovascular;  Laterality: N/A;   ENUCLEATION Right    RUPTURED GLOBE EXPLORATION AND REPAIR Left    TOTAL HIP ARTHROPLASTY Right 12/02/2021   Procedure: TOTAL HIP ARTHROPLASTY;  Surgeon: Marchia Bond, MD;  Location: [facility];  Service: Orthopedics;  Laterality: Right;     Social History:   reports that he has been smoking cigarettes. He has a 60.00 pack-year smoking history. He has never used smokeless tobacco. He reports that he does not drink alcohol and does not use drugs.   Family History:  His family history includes Hypertension in his mother.   Allergies No Known Allergies   Home Medications  Prior to Admission medications   Medication Sig Start Date End Date Taking? Authorizing Provider  acetaminophen (TYLENOL) 500 MG tablet Take 1,000 mg by mouth daily as needed for mild pain, moderate pain, fever or headache.   Yes [provider]  albuterol (VENTOLIN HFA) 108 (90 Base) MCG/ACT inhaler Inhale 2 puffs into the lungs every 6 (six) hours as needed for wheezing or shortness of breath. 07/18/20  Yes [provider]  amLODipine (NORVASC) 2.5 MG tablet Take 1 tablet (2.5 mg total) by mouth daily. 07/31/20  Yes Dixie Dials, MD  aspirin EC 81 MG tablet Take 81 mg by mouth daily.   Yes [provider]  atorvastatin (LIPITOR) 40 MG tablet Take 40 mg by mouth at bedtime. 05/16/20  Yes [provider]  clopidogrel (PLAVIX) 75 MG tablet Take 75 mg by mouth daily.   Yes [provider]  hydrALAZINE (APRESOLINE) 10 MG tablet Take 10 mg by mouth 2 (two) times daily.   Yes [provider]  lisinopril (ZESTRIL) 10 MG tablet Take 10 mg by mouth daily. 07/04/20  Yes [provider]  Multiple Vitamins-Minerals (CENTRUM SILVER 50+MEN) TABS Take 1 tablet by mouth at bedtime.   Yes [provider]  potassium chloride (KLOR-CON) 10 MEQ tablet Take 10 mEq by mouth daily. 09/17/21  Yes [provider]  timolol (BETIMOL) 0.5 % ophthalmic solution Place 1 drop into the left eye 2 (two) times daily.   Yes [provider]     Critical care time: na

## 2022-10-20 NOTE — Progress Notes (Signed)
  Echocardiogram 2D Echocardiogram has been performed.  Terry Knox 10/20/2022, 12:12 PM

## 2022-10-21 LAB — SARS CORONAVIRUS 2 BY RT PCR: SARS Coronavirus 2 by RT PCR: NEGATIVE

## 2022-10-21 NOTE — Progress Notes (Signed)
PCCM Brief Progress Note  Dyspnea Acute exacerbation of COPD LLL mass, bilateral solid nodules, mediastinal and hilar LAD - EBUS under general tomorrow at 1230 - NPO after midnight - heparin sq held, SCDs for now - discussed risks including respiratory failure 5-10%, pneumothorax 1%, major bleeding 09/998 with both pt and daughter Terry Knox, they agree to proceed   [employer]

## 2022-10-21 NOTE — TOC Progression Note (Addendum)
Transition of Care [facility]) - Progression Note    Patient Details  Name: Terry Knox MRN: [id number] Date of Birth: 1941-07-11  [facility] CM/SW Contact  Terry Collet, RN Phone Number: 10/21/2022, 10:45 AM  Clinical Narrative:     Returned call from daughter Terry Knox. She identifies herself as a former Education officer, museum and states that after she read notes through My Chart, she has concerns about information patient told CM yesterday he is not being fully accurate. She lives [place] and last visited [place] in July. She shares that the patient lives with her sister Terry Knox and niece Terry Knox. She states that she speaks to the patient daily on the phone. She states that her sister and niece are hardly ever home, and that the house doesn't have plumbing or heat.  She reviewed some of his existing medical conditions including poor vision and being blind in one eye. She describes him as being active and gardening until this recent "cold" he couldn't shake which led him to this hospitalization.  She states that she has been working on getting him housing through [employer], and he makes $1251/month. She states that she has been talking to her father about moving to [place] to stay with her, but he has declined.  LVMs back and forth w patient's daughter Terry Knox, will need to discuss home situation w her. Spoke w Terry Knox and she confirms that the patient lives with her. She states that she is home roughly 3 nights of the week. That he was independent around the home PTA, does not have any ambulatory DME at home. She states that the house, address listed in chart, does in fact have plumbing, electricity, heat, and a phone. Terry Knox and other family/ friends provide transportation when needed and will be able to pick up the patient at [place].  Discussed with Terry Knox that patient will return home at [place], and we would not assist with housing changes or keep patient in the hospital while the family decided  upon and worked on this with [employer].    Terry Knox Granddaughter [phone]  [phone]  Palmer,Terry Knox Daughter [phone]    Terry Knox,Terry Knox Daughter   [phone]   Expected Discharge Plan: Home/Self Care Barriers to Discharge: Continued Medical Work up  Expected Discharge Plan and Services   Discharge Planning Services: CM Consult   Living arrangements for the past 2 months: Single Family Home                 DME Arranged: N/A         HH Arranged: NA           Social Determinants of Health (SDOH) Interventions SDOH Screenings   Tobacco Use: High Risk (12/03/2021)    Readmission Risk Interventions     No data to display

## 2022-10-21 NOTE — Progress Notes (Signed)
Patients daughter Nelda Marseille called [phone]) requesting a call from the pulmonary MD. DrMeier made aware.

## 2022-10-21 NOTE — Progress Notes (Signed)
Ref: Dixie Dials, MD   Subjective:  Awake. Vs stable with PO prednisone, azithromycin 500 mg. daily and breathing treatments. Decreasing cough. Low normal O2 saturations with activity. Awaiting pulmonary work up.  Objective:  Vital Signs in the last 24 hours: Temp:  [98.4 F (36.9 C)-98.8 F (37.1 C)] 98.4 F (36.9 C) (02/07 0443) Pulse Rate:  [67-70] 70 (02/07 0443) Resp:  [17-18] 18 (02/07 0443) BP: (109-120)/(55-64) 120/64 (02/07 0443) SpO2:  [93 %-96 %] 94 % (02/07 0443)  Physical Exam: BP Readings from Last 1 Encounters:  10/21/22 120/64     Wt Readings from Last 1 Encounters:  12/02/21 58.5 kg    Weight change:  There is no height or weight on file to calculate BMI. HEENT: [place]/AT, Eyes-Brown, Conjunctiva-Pink, Sclera-Non-icteric Neck: No JVD, No bruit, Trachea midline. Lungs:  Clearing, Bilateral. Cardiac:  Regular rhythm, normal S1 and S2, no S3. II/VI systolic murmur. Abdomen:  Soft, non-tender. BS present. Extremities:  No edema present. No cyanosis. No clubbing. CNS: AxOx3, Cranial nerves grossly intact, moves all 4 extremities.  Skin: Warm and dry.   Intake/Output from previous day: 02/06 0701 - 02/07 0700 In: 1372.7 [P.O.:960; I.V.:412.7] Out: 1200 [Urine:1200]    Lab Results: BMET    Component Value Date/Time   NA 136 10/20/2022 0305   NA 135 10/19/2022 1555   NA 133 (L) 12/03/2021 0326   K 4.1 10/20/2022 0305   K 4.2 10/19/2022 1555   K 4.7 12/03/2021 0326   CL 101 10/20/2022 0305   CL 98 10/19/2022 1555   CL 101 12/03/2021 0326   CO2 25 10/20/2022 0305   CO2 26 10/19/2022 1555   CO2 27 12/03/2021 0326   GLUCOSE 99 10/20/2022 0305   GLUCOSE 99 10/19/2022 1555   GLUCOSE 152 (H) 12/03/2021 0326   BUN 12 10/20/2022 0305   BUN 13 10/19/2022 1555   BUN 22 12/03/2021 0326   CREATININE 0.84 10/20/2022 0305   CREATININE 0.75 10/19/2022 1555   CREATININE 0.84 12/03/2021 0326   CALCIUM 9.0 10/20/2022 0305   CALCIUM 9.1 10/19/2022 1555    CALCIUM 8.7 (L) 12/03/2021 0326   GFRNONAA >60 10/20/2022 0305   GFRNONAA >60 10/19/2022 1555   GFRNONAA >60 12/03/2021 0326   GFRAA >60 03/17/2019 0948   GFRAA >60 06/08/2015 0346   GFRAA >60 06/06/2015 0649   CBC    Component Value Date/Time   WBC 6.8 10/20/2022 0305   RBC 3.55 (L) 10/20/2022 0305   HGB 11.3 (L) 10/20/2022 0305   HCT 33.5 (L) 10/20/2022 0305   PLT 422 (H) 10/20/2022 0305   MCV 94.4 10/20/2022 0305   MCH 31.8 10/20/2022 0305   MCHC 33.7 10/20/2022 0305   RDW 12.1 10/20/2022 0305   LYMPHSABS 1.7 10/19/2022 1555   MONOABS 0.4 10/19/2022 1555   EOSABS 0.2 10/19/2022 1555   BASOSABS 0.0 10/19/2022 1555   HEPATIC Function Panel Recent Labs    10/19/22 1555  PROT 6.5  ALBUMIN 2.6*  AST 25  ALT 13  ALKPHOS 50   HEMOGLOBIN A1C Lab Results  Component Value Date   MPG 119.76 07/26/2020   CARDIAC ENZYMES Lab Results  Component Value Date   TROPONINI <0.03 06/06/2015   TROPONINI <0.03 06/06/2015   TROPONINI <0.03 06/05/2015   BNP No results for input(s): "PROBNP" in the last 8760 hours. TSH No results for input(s): "TSH" in the last 8760 hours. CHOLESTEROL No results for input(s): "CHOL" in the last 8760 hours.  Scheduled Meds:  azithromycin  500 mg Oral Daily   fluticasone furoate-vilanterol  1 puff Inhalation Daily   guaiFENesin  600 mg Oral BID   heparin  5,000 Units Subcutaneous Q8H   nicotine  21 mg Transdermal Daily   predniSONE  40 mg Oral Q breakfast   Continuous Infusions:  sodium chloride 50 mL/hr at 10/20/22 0606   PRN Meds:.albuterol  Assessment/Plan: Acute exacerbation of COPD LLL mass with solid nodules and mediastinal and hilar LAD Chronic tobacco use disorder HTN HLD Old stroke  Plan: Appreciate pulmonary consult..   LOS: 2 days   Time spent including chart review, lab review, examination, discussion with patient : 30 min   Dixie Dials  MD  10/21/2022, 8:50 AM

## 2022-10-21 NOTE — Progress Notes (Signed)
Case manager Carles Collet made aware that patients daughter Nelda Marseille [phone] would like a call to discuss fathers living situation.

## 2022-10-22 ENCOUNTER — Encounter (HOSPITAL_COMMUNITY): Payer: Self-pay | Admitting: Cardiovascular Disease

## 2022-10-22 ENCOUNTER — Inpatient Hospital Stay (HOSPITAL_COMMUNITY): Payer: Medicare HMO | Admitting: Certified Registered Nurse Anesthetist

## 2022-10-22 ENCOUNTER — Telehealth: Payer: Self-pay | Admitting: Student

## 2022-10-22 ENCOUNTER — Encounter (HOSPITAL_COMMUNITY)
Admission: AD | Disposition: A | Discharge status: Home / Self Care | Payer: Self-pay | Source: Home / Self Care | Attending: Cardiovascular Disease

## 2022-10-22 ENCOUNTER — Ambulatory Visit (HOSPITAL_COMMUNITY): Admit: 2022-10-22 | Payer: Medicare HMO | Admitting: Student

## 2022-10-22 DIAGNOSIS — I10 Essential (primary) hypertension: Secondary | ICD-10-CM | POA: Diagnosis not present

## 2022-10-22 DIAGNOSIS — F17210 Nicotine dependence, cigarettes, uncomplicated: Secondary | ICD-10-CM | POA: Diagnosis not present

## 2022-10-22 DIAGNOSIS — R918 Other nonspecific abnormal finding of lung field: Secondary | ICD-10-CM

## 2022-10-22 DIAGNOSIS — M1990 Unspecified osteoarthritis, unspecified site: Secondary | ICD-10-CM

## 2022-10-22 DIAGNOSIS — R590 Localized enlarged lymph nodes: Secondary | ICD-10-CM | POA: Diagnosis not present

## 2022-10-22 HISTORY — PX: BRONCHIAL NEEDLE ASPIRATION BIOPSY: SHX5106

## 2022-10-22 HISTORY — PX: VIDEO BRONCHOSCOPY WITH ENDOBRONCHIAL ULTRASOUND: SHX6177

## 2022-10-22 LAB — EXPECTORATED SPUTUM ASSESSMENT W GRAM STAIN, RFLX TO RESP C: Special Requests: NORMAL

## 2022-10-22 LAB — CULTURE, RESPIRATORY W GRAM STAIN
Culture: NORMAL
Special Requests: NORMAL

## 2022-10-22 SURGERY — ENDOBRONCHIAL ULTRASOUND (EBUS)
Anesthesia: General | Laterality: Bilateral

## 2022-10-22 SURGERY — BRONCHOSCOPY, WITH EBUS
Anesthesia: General | Laterality: Bilateral

## 2022-10-22 MED ORDER — PHENYLEPHRINE 80 MCG/ML (10ML) SYRINGE FOR IV PUSH (FOR BLOOD PRESSURE SUPPORT)
PREFILLED_SYRINGE | INTRAVENOUS | Status: DC | PRN
  Administered 2022-10-22: 80 ug via INTRAVENOUS

## 2022-10-22 MED ORDER — PHENYLEPHRINE HCL-NACL 20-0.9 MG/250ML-% IV SOLN
INTRAVENOUS | Status: DC | PRN
  Administered 2022-10-22: 30 ug/min via INTRAVENOUS

## 2022-10-22 MED ORDER — OXYCODONE HCL 5 MG/5ML PO SOLN: 5.0000 mg | Freq: Once | ORAL | Status: DC | PRN

## 2022-10-22 MED ORDER — DEXAMETHASONE SODIUM PHOSPHATE 10 MG/ML IJ SOLN
INTRAMUSCULAR | Status: DC | PRN
  Administered 2022-10-22: 10 mg via INTRAVENOUS

## 2022-10-22 MED ORDER — LIDOCAINE 2% (20 MG/ML) 5 ML SYRINGE
INTRAMUSCULAR | Status: DC | PRN
  Administered 2022-10-22: 60 mg via INTRAVENOUS

## 2022-10-22 MED ORDER — FENTANYL CITRATE (PF) 100 MCG/2ML IJ SOLN: 25.0000 ug | INTRAMUSCULAR | Status: DC | PRN

## 2022-10-22 MED ORDER — SUGAMMADEX SODIUM 200 MG/2ML IV SOLN
INTRAVENOUS | Status: DC | PRN
  Administered 2022-10-22: 150 mg via INTRAVENOUS

## 2022-10-22 MED ORDER — LACTATED RINGERS IV SOLN: INTRAVENOUS | Status: DC

## 2022-10-22 MED ORDER — PROPOFOL 10 MG/ML IV BOLUS
INTRAVENOUS | Status: DC | PRN
  Administered 2022-10-22: 100 mg via INTRAVENOUS

## 2022-10-22 MED ORDER — OXYCODONE HCL 5 MG PO TABS: 5.0000 mg | ORAL_TABLET | Freq: Once | ORAL | Status: DC | PRN

## 2022-10-22 MED ORDER — ONDANSETRON HCL 4 MG/2ML IJ SOLN
INTRAMUSCULAR | Status: DC | PRN
  Administered 2022-10-22: 4 mg via INTRAVENOUS

## 2022-10-22 MED ORDER — ROCURONIUM BROMIDE 10 MG/ML (PF) SYRINGE
PREFILLED_SYRINGE | INTRAVENOUS | Status: DC | PRN
  Administered 2022-10-22: 50 mg via INTRAVENOUS

## 2022-10-22 MED ORDER — FENTANYL CITRATE (PF) 100 MCG/2ML IJ SOLN
INTRAMUSCULAR | Status: DC | PRN
  Administered 2022-10-22 (×2): 50 ug via INTRAVENOUS

## 2022-10-22 MED ORDER — ONDANSETRON HCL 4 MG/2ML IJ SOLN: 4.0000 mg | Freq: Four times a day (QID) | INTRAMUSCULAR | Status: DC | PRN

## 2022-10-22 MED ORDER — PROPOFOL 500 MG/50ML IV EMUL
INTRAVENOUS | Status: DC | PRN
  Administered 2022-10-22: 100 ug/kg/min via INTRAVENOUS

## 2022-10-22 NOTE — TOC CM/SW Note (Addendum)
Received a message to call patient's daughter Terry Knox [phone] . Patient consented. NCM called patient's daughter Terry Knox [phone] at bedside and placed on speaker phone. Dr Doylene Canard also present and provided medical update.  NCM confirmed with Dr Doylene Canard patient can make his own decisions.  patient's daughter Terry Knox [phone] has concerns about patient staying with his daughter [name] at discharge. See TOC note from yesterday.  Terry aware patient having biopsy done today . She was told "they" believe it's stage 4.  She would like patient to go to SNF at discharge for short term rehab . NCM explained would need plan of care ,  prognosis , MD recommendations PT notes , insurance approval etc. If it is stage 4 may be more comfort care . Daughter voiced understanding stating she just wanted to know her options.  Her ultimate plan is to move patient to [place] will continue to follow. Encouraged patient to discuss plan with both daughters

## 2022-10-22 NOTE — Progress Notes (Signed)
   10/22/22 1012  Mobility  Activity Ambulated independently in hallway  Level of Assistance Independent  Assistive Device None  Distance Ambulated (ft) 460 ft  Activity Response Tolerated well  Mobility Referral Yes  $Mobility charge 1 Mobility   Mobility Specialist Progress Note  Pre-Mobility: 97% SpO2 During Mobility: 90% SpO2 Post-Mobility: 95% SpO2  Pt was in bed and agreeable. Had c/o feeling SOB throughout. X1 seated break to look out the window. Returned to bed w/ all needs met and call bell in reach.   Lucious Groves Mobility Specialist  Please contact via SecureChat or Rehab office at [phone]

## 2022-10-22 NOTE — Interval H&P Note (Signed)
History and Physical Interval Note:  10/22/2022 12:21 PM  Terry Knox  has presented today for surgery, with the diagnosis of unknown.  The various methods of treatment have been discussed with the patient and family. After consideration of risks, benefits and other options for treatment, the patient has consented to  Procedure(s): [place] (Bilateral) as a surgical intervention.  The patient's history has been reviewed, patient examined, no change in status, stable for surgery.  I have reviewed the patient's chart and labs.  Questions were answered to the patient's satisfaction.     Maryjane Hurter

## 2022-10-22 NOTE — Anesthesia Procedure Notes (Signed)
Procedure Name: Intubation Date/Time: 10/22/2022 12:48 PM  Performed by: Harden Mo, CRNAPre-anesthesia Checklist: Patient identified, Emergency Drugs available, Suction available and Patient being monitored Patient Re-evaluated:Patient Re-evaluated prior to induction Oxygen Delivery Method: Circle System Utilized Preoxygenation: Pre-oxygenation with 100% oxygen Induction Type: IV induction Ventilation: Mask ventilation without difficulty Laryngoscope Size: Miller and 2 Grade View: Grade I Tube type: Oral Tube size: 8.5 mm Number of attempts: 1 Airway Equipment and Method: Stylet and Oral airway Placement Confirmation: ETT inserted through vocal cords under direct vision, positive ETCO2 and breath sounds checked- equal and bilateral Secured at: 23 cm Tube secured with: Tape Dental Injury: Teeth and Oropharynx as per pre-operative assessment

## 2022-10-22 NOTE — Telephone Encounter (Signed)
Can we set up clinic follow up with me or APP in 2 weeks to follow up on EBUS results?  Thanks! Nate

## 2022-10-22 NOTE — Progress Notes (Signed)
PCCM Brief Plan of Care Note  Dyspnea Acute exacerbation of COPD LLL mass, bilateral solid nodules, mediastinal and hilar LAD - continue azithro 5d course - prednisone 40 mg for 5 days - continue current bronchodilators - walking oximetry prior to discharge - I will set up clinic follow up to review bronch results and ensure appropriate referrals are made  Will sign off but glad to be reinvolved as condition changes  [place]

## 2022-10-22 NOTE — Anesthesia Preprocedure Evaluation (Signed)
Anesthesia Evaluation  Patient identified by MRN, date of birth, ID band Patient awake    Reviewed: Allergy & Precautions, H&P , NPO status , Patient's Chart, lab work & pertinent test results  Airway Mallampati: II   Neck ROM: full    Dental   Pulmonary Current Smoker   breath sounds clear to auscultation       Cardiovascular hypertension,  Rhythm:regular Rate:Normal     Neuro/Psych CVA    GI/Hepatic   Endo/Other    Renal/GU      Musculoskeletal  (+) Arthritis ,    Abdominal   Peds  Hematology   Anesthesia Other Findings   Reproductive/Obstetrics                             Anesthesia Physical Anesthesia Plan  ASA: 3  Anesthesia Plan: General   Post-op Pain Management:    Induction: Intravenous  PONV Risk Score and Plan: 1 and Ondansetron, Dexamethasone and Treatment may vary due to age or medical condition  Airway Management Planned: Oral ETT  Additional Equipment:   Intra-op Plan:   Post-operative Plan: Extubation in OR  Informed Consent: I have reviewed the patients History and Physical, chart, labs and discussed the procedure including the risks, benefits and alternatives for the proposed anesthesia with the patient or authorized representative who has indicated his/her understanding and acceptance.     Dental advisory given  Plan Discussed with: CRNA, Anesthesiologist and Surgeon  Anesthesia Plan Comments:        Anesthesia Quick Evaluation

## 2022-10-22 NOTE — Transfer of Care (Signed)
Immediate Anesthesia Transfer of Care Note  Patient: Terry Knox  Procedure(s) Performed: VIDEO BRONCHOSCOPY WITH ENDOBRONCHIAL ULTRASOUND (Bilateral) BRONCHIAL NEEDLE ASPIRATION BIOPSIES  Patient Location: PACU  Anesthesia Type:General  Level of Consciousness: drowsy  Airway & Oxygen Therapy: Patient Spontanous Breathing and Patient connected to face mask oxygen  Post-op Assessment: Report given to RN and Post -op Vital signs reviewed and stable  Post vital signs: Reviewed and stable  Last Vitals:  Vitals Value Taken Time  BP 152/75 10/22/22 1336  Temp    Pulse 73 10/22/22 1339  Resp 24 10/22/22 1339  SpO2 98 % 10/22/22 1339  Vitals shown include unvalidated device data.  Last Pain:  Vitals:   10/22/22 1145  TempSrc: Temporal  PainSc: 0-No pain         Complications: No notable events documented.

## 2022-10-22 NOTE — Op Note (Signed)
Flexible and EBUS Bronchoscopy Procedure Note  Terry Knox  [id number]  December 27, 1940  Date:10/22/22  Time:1:30 PM   Provider Performing:Toye Rouillard Jerilynn Mages Verlee Monte   Procedure: Flexible bronchoscopy and EBUS Bronchoscopy  Indication(s) Lung mass Mediastinal lymphadenopathy Pulmonary nodules  Consent Risks of the procedure as well as the alternatives and risks of each were explained to the patient and/or caregiver.  Consent for the procedure was obtained.  Anesthesia General Anesthesia   Time Out Verified patient identification, verified procedure, site/side was marked, verified correct patient position, special equipment/implants available, medications/allergies/relevant history reviewed, required imaging and test results available.   Sterile Technique Usual hand hygiene, masks, gowns, and gloves were used   Procedure Description Diagnostic bronchoscope advanced through endotracheal tube and into airway.  Airways were examined down to subsegmental level with findings noted below.  Following diagnostic evaluation the EBUS bronchoscope was advanced into airway with station 11L biopsied and sent for slide, cell block, and/or culture.  The EBUS bronchoscope was removed after assuring no active bleeding from biopsy site.  Findings:  - surprisingly no obvious endobronchial tumor - thick secretions occluding LLL which were suctioned - EBNA performed of 5mm 11L station lymph node with 9 passes   Complications/Tolerance None; patient tolerated the procedure well. Chest X-ray is not needed post procedure.   EBL Minimal   Specimen(s) EBNA sent for cytology Blood draw for guardant

## 2022-10-22 NOTE — Progress Notes (Signed)
Ref: Dixie Dials, MD   Subjective:  Awake. VS stable. Aware of pulmonary EBUS procedure. Daughter [place] is also in agreement over phone by case Freight forwarder.  Objective:  Vital Signs in the last 24 hours: Temp:  [97.9 F (36.6 C)-98.6 F (37 C)] 98.1 F (36.7 C) (02/08 0858) Pulse Rate:  [58-65] 58 (02/08 0858) Resp:  [17-20] 17 (02/08 0858) BP: (112-124)/(53-68) 121/68 (02/08 0858) SpO2:  [95 %-96 %] 96 % (02/08 0858)  Physical Exam: BP Readings from Last 1 Encounters:  10/22/22 121/68     Wt Readings from Last 1 Encounters:  12/02/21 58.5 kg    Weight change:  There is no height or weight on file to calculate BMI. HEENT: [place]/AT, Eyes-Brown, artificial right eye, Conjunctiva-Pink, Sclera-Non-icteric Neck: No JVD, No bruit, Trachea midline. Lungs:  Clearing, Bilateral. Cardiac:  Regular rhythm, normal S1 and S2, no S3. II/VI systolic murmur. Abdomen:  Soft, non-tender. BS present. Extremities:  No edema present. No cyanosis. No clubbing. CNS: AxOx3, Cranial nerves grossly intact, moves all 4 extremities.  Skin: Warm and dry.   Intake/Output from previous day: 02/07 0701 - 02/08 0700 In: 480 [P.O.:480] Out: 300 [Urine:300]    Lab Results: BMET    Component Value Date/Time   NA 136 10/20/2022 0305   NA 135 10/19/2022 1555   NA 133 (L) 12/03/2021 0326   K 4.1 10/20/2022 0305   K 4.2 10/19/2022 1555   K 4.7 12/03/2021 0326   CL 101 10/20/2022 0305   CL 98 10/19/2022 1555   CL 101 12/03/2021 0326   CO2 25 10/20/2022 0305   CO2 26 10/19/2022 1555   CO2 27 12/03/2021 0326   GLUCOSE 99 10/20/2022 0305   GLUCOSE 99 10/19/2022 1555   GLUCOSE 152 (H) 12/03/2021 0326   BUN 12 10/20/2022 0305   BUN 13 10/19/2022 1555   BUN 22 12/03/2021 0326   CREATININE 0.84 10/20/2022 0305   CREATININE 0.75 10/19/2022 1555   CREATININE 0.84 12/03/2021 0326   CALCIUM 9.0 10/20/2022 0305   CALCIUM 9.1 10/19/2022 1555   CALCIUM 8.7 (L) 12/03/2021 0326   GFRNONAA >60  10/20/2022 0305   GFRNONAA >60 10/19/2022 1555   GFRNONAA >60 12/03/2021 0326   GFRAA >60 03/17/2019 0948   GFRAA >60 06/08/2015 0346   GFRAA >60 06/06/2015 0649   CBC    Component Value Date/Time   WBC 6.8 10/20/2022 0305   RBC 3.55 (L) 10/20/2022 0305   HGB 11.3 (L) 10/20/2022 0305   HCT 33.5 (L) 10/20/2022 0305   PLT 422 (H) 10/20/2022 0305   MCV 94.4 10/20/2022 0305   MCH 31.8 10/20/2022 0305   MCHC 33.7 10/20/2022 0305   RDW 12.1 10/20/2022 0305   LYMPHSABS 1.7 10/19/2022 1555   MONOABS 0.4 10/19/2022 1555   EOSABS 0.2 10/19/2022 1555   BASOSABS 0.0 10/19/2022 1555   HEPATIC Function Panel Recent Labs    10/19/22 1555  PROT 6.5  ALBUMIN 2.6*  AST 25  ALT 13  ALKPHOS 50   HEMOGLOBIN A1C Lab Results  Component Value Date   MPG 119.76 07/26/2020   CARDIAC ENZYMES Lab Results  Component Value Date   TROPONINI <0.03 06/06/2015   TROPONINI <0.03 06/06/2015   TROPONINI <0.03 06/05/2015   BNP No results for input(s): "PROBNP" in the last 8760 hours. TSH No results for input(s): "TSH" in the last 8760 hours. CHOLESTEROL No results for input(s): "CHOL" in the last 8760 hours.  Scheduled Meds:  azithromycin  500  mg Oral Daily   fluticasone furoate-vilanterol  1 puff Inhalation Daily   guaiFENesin  600 mg Oral BID   nicotine  21 mg Transdermal Daily   predniSONE  40 mg Oral Q breakfast   Continuous Infusions:  sodium chloride 50 mL/hr at 10/20/22 0606   PRN Meds:.albuterol  Assessment/Plan: Acute exacerbation of COPD LLL mass with solid nodules and mediastinal and hilar LAD Chronic tobacco use disorder HTN HLD Old stroke  Plan: Awaiting EBUS this afternoon. Possible SNF placement.    LOS: 3 days   Time spent including chart review, lab review, examination, discussion with patient : 30 min   Dixie Dials  MD  10/22/2022, 11:41 AM

## 2022-10-23 MED ORDER — NICOTINE 21 MG/24HR TD PT24: 21.0000 mg | MEDICATED_PATCH | Freq: Every day | TRANSDERMAL | 0 refills | Status: AC

## 2022-10-23 MED ORDER — GUAIFENESIN ER 600 MG PO TB12: 600.0000 mg | ORAL_TABLET | Freq: Two times a day (BID) | ORAL | 0 refills | Status: AC

## 2022-10-23 MED ORDER — BISACODYL 5 MG PO TBEC
10.0000 mg | DELAYED_RELEASE_TABLET | Freq: Every day | ORAL | Status: DC | PRN
  Administered 2022-10-23: 10 mg via ORAL
  Filled 2022-10-23: qty 2

## 2022-10-23 MED ORDER — PREDNISONE 20 MG PO TABS: 40.0000 mg | ORAL_TABLET | Freq: Every day | ORAL | 0 refills | Status: AC

## 2022-10-23 NOTE — Care Management Important Message (Signed)
Important Message  Patient Details  Name: Terry Knox MRN: [id number] Date of Birth: 19-Feb-1941   Medicare Important Message Given:  Yes     Hannah Beat 10/23/2022, 1:29 PM

## 2022-10-23 NOTE — Progress Notes (Addendum)
Nurse requested Mobility Specialist to perform oxygen saturation test with pt which includes removing pt from oxygen both at rest and while ambulating.  Below are the results from that testing.     Patient Saturations on Room Air at Rest = spO2 90%  Patient Saturations on Room Air while Ambulating = sp02 86% .    Patient Saturations on 3 Liters of oxygen while Ambulating = sp02 92%  At end of testing pt left in room on RA.    Reported results to nurse.

## 2022-10-23 NOTE — Progress Notes (Deleted)
Mobility Specialist Progress Note    10/23/22 1034  Mobility  Activity Refused mobility   Pt stated the MD told him to stay in his bed until the MD comes back.   Terry Knox Mobility Specialist  Please Psychologist, sport and exercise or Rehab Office at [phone]

## 2022-10-23 NOTE — TOC CM/SW Note (Addendum)
Patient being discharged . Notified by nurse home oxygen needed.   NCM secure chatted MD for order and [employer] now requiring MD to co sign ambulation saturation note.   Spoke to patient at bedside. Called [name] left a message. Called Joya no answer and voicemail box full. Patient stated [name] will return call. Patient did NOT consent for NCM to call daughter Elwyn Reach . Dr Doylene Canard has spoken to [facility] and she is aware patient discharging today . Patient states his friend can pick him up. Dr Doylene Canard asking TOC to provide cab voucher to patient if friend unable to transport   Nurse and MD aware .   Once order placed and note co signed [employer] can start processing order for oxygen . Erasmo Downer with [employer] waiting

## 2022-10-23 NOTE — Discharge Summary (Addendum)
Physician Discharge Summary  Patient ID: Terry Knox MRN: [id number] DOB/AGE: December 10, 1940 82 y.o.  Admit date: 10/19/2022 Discharge date: 10/23/2022  Admission Diagnoses: Acute bronchitis COPD, exacerbation HTN HLD Tobacco use disorder Old stroke  Discharge Diagnoses:  Principal Problem:   Acute exacerbation of COPD Active Problems:   Lung mass, LLL area   Small cell cancer of lung/lymph node post biopsy report   Mediastinal lymphadenopathy   HTN   HLD   Tobacco use disorder   Old stroke  Discharged Condition: stable  Hospital Course: 82 years old black male with PMH of HTN, HLD, COPD, old stroke and chronic tobacco use disorder had cough with thick sputum and shortness of breath. His respiratory panel and COVID tests were negative for infection but chest x-ray followed by CT scan of chest with contrast was positive for LLL mass and lymphadenopathy with lung metastatic lesions.  He had pulmonary consult. He received Zithromax 500 mg. Daily x 5 days and short course of prednisone 40 mg. Daily x 5 days. He underwent EBUS bronchoscopy  with biopsy results pending at the time of discharge. He remained stable, ambulated well and declined SNF admission. Hence he was discharged home with family agreeing to keep close watch on him.  He will see pulmonary doctor in about 2 weeks and see me in about 3 weeks. He will also see Oncologist post biopsy report. He was advised to refrain from smoking and continue nicotine patch to help him quit.  Consults: pulmonary/intensive care  Significant Diagnostic Studies: labs: CBC showed normal WBC count, mildly elevated platelets and Hgb of 11.5 gm. BMET was normal.  CT chest and CXR were positive for Large LLL mass with mets, mediastinal and hilar lymphadenopathy, emphysema and aortic atherosclerosis.  Biopsy report: . LYMPH NODE, 11L, FINE NEEDLE ASPIRATION:  - Malignant cells consistent with small cell carcinoma,   EKG: NSR, anteroseptal infarct,  old.  Echocardiogram: Normal LV systolic function, mild MR and TR.  Treatments: antibiotics: azithromycin and Prednisone. EBUS bronchoscopy with biopsy.  Discharge Exam: Blood pressure (!) 151/71, pulse 61, temperature 98.2 F (36.8 C), temperature source Oral, resp. rate 17, SpO2 96 %. General appearance: alert, cooperative and appears stated age. Head: Normocephalic, atraumatic. Eyes: Brown eyes, artificial right eye, pink conjunctiva, corneas clear.  Neck: No adenopathy, no carotid bruit, no JVD, supple, symmetrical, trachea midline and thyroid not enlarged. Resp: Clearing to auscultation bilaterally. Cardio: Regular rate and rhythm, S1, S2 normal, II/VI systolic murmur, no click, rub or gallop. GI: Soft, non-tender; bowel sounds normal; no organomegaly. Extremities: No edema, cyanosis or clubbing. Skin: Warm and dry.  Neurologic: Alert and oriented X 3, normal strength and tone. Normal coordination and slow gait.  Disposition: Discharge disposition: 01-Home or Self Care        Allergies as of 10/23/2022   No Known Allergies      Medication List     TAKE these medications    acetaminophen 500 MG tablet Commonly known as: TYLENOL Take 1,000 mg by mouth daily as needed for mild pain, moderate pain, fever or headache.   albuterol 108 (90 Base) MCG/ACT inhaler Commonly known as: VENTOLIN HFA Inhale 2 puffs into the lungs every 6 (six) hours as needed for wheezing or shortness of breath.   amLODipine 2.5 MG tablet Commonly known as: NORVASC Take 1 tablet (2.5 mg total) by mouth daily.   aspirin EC 81 MG tablet Take 81 mg by mouth daily.   atorvastatin 40 MG tablet Commonly known as:  LIPITOR Take 40 mg by mouth at bedtime.   Centrum Silver 50+Men Tabs Take 1 tablet by mouth at bedtime.   clopidogrel 75 MG tablet Commonly known as: PLAVIX Take 75 mg by mouth daily.   guaiFENesin 600 MG 12 hr tablet Commonly known as: MUCINEX Take 1 tablet (600 mg total) by  mouth 2 (two) times daily.   hydrALAZINE 10 MG tablet Commonly known as: APRESOLINE Take 10 mg by mouth 2 (two) times daily.   lisinopril 10 MG tablet Commonly known as: ZESTRIL Take 10 mg by mouth daily.   nicotine 21 mg/24hr patch Commonly known as: NICODERM CQ - dosed in mg/24 hours Place 1 patch (21 mg total) onto the skin daily.   potassium chloride 10 MEQ tablet Commonly known as: KLOR-CON Take 10 mEq by mouth daily.   predniSONE 20 MG tablet Commonly known as: DELTASONE Take 2 tablets (40 mg total) by mouth daily with breakfast. Start taking on: October 24, 2022   timolol 0.5 % ophthalmic solution Commonly known as: BETIMOL Place 1 drop into the left eye 2 (two) times daily.        Follow-up Information     Dixie Dials, MD Follow up in 3 week(s).   Specialty: Cardiology Why: After seeing lung doctor in 2 weeks Contact information: [place]  [id number] [phone]         Maryjane Hurter, MD Follow up in 2 week(s).   Specialty: Pulmonary Disease Contact information: [place] [address] [phone]                 Time spent: Review of old chart, current chart, lab, x-ray, cardiac tests and discussion with patient over 60 minutes.  Signed: Birdie Riddle 10/23/2022, 10:23 AM

## 2022-10-23 NOTE — Anesthesia Postprocedure Evaluation (Signed)
Anesthesia Post Note  Patient: Terry Knox  Procedure(s) Performed: VIDEO BRONCHOSCOPY WITH ENDOBRONCHIAL ULTRASOUND (Bilateral) BRONCHIAL NEEDLE ASPIRATION BIOPSIES     Patient location during evaluation: PACU Anesthesia Type: General Level of consciousness: awake and alert Pain management: pain level controlled Vital Signs Assessment: post-procedure vital signs reviewed and stable Respiratory status: spontaneous breathing, nonlabored ventilation, respiratory function stable and patient connected to nasal cannula oxygen Cardiovascular status: blood pressure returned to baseline and stable Postop Assessment: no apparent nausea or vomiting Anesthetic complications: no   No notable events documented.  Last Vitals:  Vitals:   10/23/22 0642 10/23/22 0846  BP: 133/74 (!) 151/71  Pulse: 64 61  Resp: 18 17  Temp: 36.8 C 36.8 C  SpO2: 94% 96%    Last Pain:  Vitals:   10/23/22 0846  TempSrc: Oral  PainSc:                  [place]ner S

## 2022-10-23 NOTE — Progress Notes (Signed)
Mobility Specialist Progress Note    10/23/22 1048  Mobility  Activity Ambulated independently in room  Level of Assistance Standby assist, set-up cues, supervision of patient - no hands on  Assistive Device None  Distance Ambulated (ft) 300 ft  Activity Response Tolerated well  Mobility Referral Yes  $Mobility charge 1 Mobility   Pt received and agreeable to ambulatory saturation test with max encouragement. No complaints. SpO2 as low as 86% on RA. Required 3LO2 to maintain SpO2 above 90%. Left sitting EOB with call bell in reach. RN notified.   Hildred Alamin Mobility Specialist  Please Psychologist, sport and exercise or Rehab Office at [phone]

## 2022-10-23 NOTE — Progress Notes (Signed)
Terry Knox to be D/C'd  per MD order.  Discussed with the patient and all questions fully answered.  VSS, Skin clean, dry and intact without evidence of skin break down, no evidence of skin tears noted.  IV catheter discontinued intact. Site without signs and symptoms of complications. Dressing and pressure applied.  An After Visit Summary was printed and given to the patient. Patient received his home oxygen and his valuable from security.  D/c education completed with patient/family including follow up instructions, medication list, d/c activities limitations if indicated, with other d/c instructions as indicated by MD - patient able to verbalize understanding, all questions fully answered.   Patient instructed to return to ED, call 911, or call MD for any changes in condition.   Patient to be escorted via [place], and D/C home via private auto.

## 2022-10-26 ENCOUNTER — Encounter (HOSPITAL_COMMUNITY): Payer: Self-pay | Admitting: Student

## 2022-10-27 ENCOUNTER — Telehealth: Payer: Self-pay | Admitting: Student

## 2022-10-27 DIAGNOSIS — C3490 Malignant neoplasm of unspecified part of unspecified bronchus or lung: Secondary | ICD-10-CM

## 2022-10-27 LAB — CYTOLOGY - NON PAP

## 2022-10-27 NOTE — Telephone Encounter (Signed)
Called and discussed with pt and daughter Ms. Clora. MR brain ordered and referrals placed to radiation oncology and oncology.   [place]

## 2022-10-29 ENCOUNTER — Telehealth: Payer: Self-pay | Admitting: Internal Medicine

## 2022-10-29 NOTE — Progress Notes (Signed)
Thoracic Location of Tumor / Histology:  Small cell carcinoma left lung  Patient presented with symptoms of: per Dr. Merrilee Jansky H&P note "COPD and long standing history of smoking cigarettes has cough with thick sputum and shortness of breath worsening over 2 weeks periods. His oxygen saturation was 89 % in office."  CT Chest w/ Contrast 10/20/2022 --IMPRESSION: Large mass of the left lower lobe with numerous satellite nodules, highly concerning for primary lung malignancy. Bronchial wall thickening and consolidation of the left lower lobe, concerning for lymphangitic spread of tumor. Enlarged mediastinal and left hilar lymph nodes, compatible with metastatic disease. Additional numerous scattered bilateral solid pulmonary nodules, largest measures up to 1.3 cm, concerning for metastatic disease. Aortic Atherosclerosis (ICD10-I70.0) and Emphysema (ICD10-J43.9).  Biopsies revealed:  10/22/2022 A. LYMPH NODE, 11L, FINE NEEDLE ASPIRATION:  - Malignant cells consistent with small cell carcinoma, see comment   Tobacco/Marijuana/Snuff/ETOH use: Reports he smokes about 5 cigarettes/day. He was prescribed nicotine patches by his PCP but reports the were not at the pharmacy when he tried to pick them up  Past/Anticipated interventions by cardiothoracic surgery, if any:  10/22/2022 --Dr. Leslye Peer Flexible bronchoscopy and EBUS Bronchoscopy   Past/Anticipated interventions by medical oncology, if any:  Met with Dr. Curt Bears earlier today  Signs/Symptoms Weight changes, if any: Reports poor appetite and feeling full quickly Wt Readings from Last 3 Encounters:  11/04/22 120 lb (54.4 kg)  11/04/22 121 lb 9.6 oz (55.2 kg)  12/02/21 129 lb (58.5 kg)   Respiratory complaints, if any: Reports having difficulty breathing while laying flat, and a frequent/productive cough. Reports sputum ranges from white to tan. Occasionally wears supplemental oxygen (has a portable pulse ox at home),  but only when his sats are below 90% Hemoptysis, if any: Denies Pain issues, if any:  Reports on-going right arm/axilla pain. States Tylenol doesn't alleviate so he "just puts up with it"  SAFETY ISSUES: Prior radiation? No Pacemaker/ICD? No  Possible current pregnancy? N/A Is the patient on methotrexate? No  Current Complaints / other details:   Had MRI Brain w/ & w/o Contrast on 11/03/2022 (final report still pending). Lives with his daughter and uses transportation through his insurance

## 2022-10-29 NOTE — Telephone Encounter (Signed)
Scheduled appt per 2/14 referral. Pt is aware of appt date and time. Pt is aware to arrive 15 mins prior to appt time and to bring and updated insurance card. Pt is aware of appt location.

## 2022-11-03 ENCOUNTER — Other Ambulatory Visit: Payer: Self-pay

## 2022-11-03 ENCOUNTER — Ambulatory Visit
Admission: RE | Admit: 2022-11-03 | Discharge: 2022-11-03 | Disposition: A | Discharge status: Home / Self Care | Payer: Medicare HMO | Source: Ambulatory Visit | Attending: Student | Admitting: Student

## 2022-11-03 DIAGNOSIS — C3490 Malignant neoplasm of unspecified part of unspecified bronchus or lung: Secondary | ICD-10-CM

## 2022-11-03 DIAGNOSIS — R918 Other nonspecific abnormal finding of lung field: Secondary | ICD-10-CM

## 2022-11-03 MED ORDER — GADOPICLENOL 0.5 MMOL/ML IV SOLN
6.0000 mL | Freq: Once | INTRAVENOUS | Status: AC | PRN
  Administered 2022-11-03: 6 mL via INTRAVENOUS

## 2022-11-03 NOTE — Progress Notes (Signed)
Radiation Oncology         [phone] ________________________________  Initial Outpatient Consultation  Name: Terry Knox MRN: [id number]  Date: 11/04/2022  DOB: 1941/01/08  RW:[id number], Ajay, MD  Maryjane Hurter, MD   REFERRING PHYSICIAN: Maryjane Hurter, MD  DIAGNOSIS: There were no encounter diagnoses.  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 male active smoker with a 60 pack year smoking history. Today, he is accompanied by ***. he is seen as a courtesy of Dr. Verlee Monte for an opinion concerning radiation therapy as part of management for his recently diagnosed left lung cancer.   The patient presented Dr. Vicenta Aly, Cardiology, on 10/19/22 with c/o a productive cough with think sputum and worsening SOB x 2 weeks. Vitals collected on arrival were notable for O2 sats in the high 80's. Subsequently, the patient was recommended admission for further evaluation and management.   Chest x-ray performed revealed a probable large rounded mass in the posterior left lower lobe, with associated left lower lobe atelectasis. Multiple small nodular densities in the right upper lobe were also appreciated, concerning for metastatic disease.  Chest CT performed while inpatient on 10/20/22 demonstrated the large left lower lobe mass as highly concerning for primary lung malignancy, along with numerous satellite nodules. CT also showed bronchial wall thickening and consolidation of the left lower lobe concerning for lymphangitic spread of tumor, enlarged mediastinal and left hilar lymph nodes compatible with metastatic disease, and additional numerous scattered bilateral solid pulmonary nodules, the largest of which measuring up to 1.3 cm, concerning for metastatic disease.  Upon admission, the patient was  started on bronchodilators and azithromycin based on concerns for acute COPD exacerbation.   The patient was evaluated by Dr. Verlee Monte while inpatient who recommended proceeding with bronchoscopy and biopsies. FNA of lymph node 11L collected on 10/22/22 showed findings consistent with small cell carcinoma  He was also completed a 5 day course of Zithromax and prednisone per Dr. Verlee Monte. He was discharged on 10/23/22 in stable condition. The patient was offered SNF admission at discharge but declined.   The patient met with Dr. Julien Nordmann earlier this afternoon. He is also scheduled to follow-up with Dr. Verlee Monte on 11/09/22.   Pertinent imaging thus far includes an MRI of the brain performed yesterday which demonstrates ***  PREVIOUS RADIATION THERAPY: No  PAST MEDICAL HISTORY:  Past Medical History:  Diagnosis Date   Hyperlipidemia    Hypertension    Stroke ([place]) 09/2021    PAST SURGICAL HISTORY: Past Surgical History:  Procedure Laterality Date   BRONCHIAL NEEDLE ASPIRATION BIOPSY  10/22/2022   Procedure: BRONCHIAL NEEDLE ASPIRATION BIOPSIES;  Surgeon: Maryjane Hurter, MD;  Location: [facility] ENDOSCOPY;  Service: Pulmonary;;   CARDIAC CATHETERIZATION N/A 06/07/2015   Procedure: Left Heart Cath and Coronary Angiography;  Surgeon: Dixie Dials, MD;  Location: [facility];  Service: Cardiovascular;  Laterality: N/A;   ENUCLEATION Right    RUPTURED GLOBE EXPLORATION AND REPAIR Left    TOTAL HIP ARTHROPLASTY Right 12/02/2021   Procedure: TOTAL HIP ARTHROPLASTY;  Surgeon: Marchia Bond, MD;  Location: [facility] ORS;  Service: Orthopedics;  Laterality: Right;   VIDEO BRONCHOSCOPY WITH ENDOBRONCHIAL ULTRASOUND Bilateral 10/22/2022   Procedure: VIDEO BRONCHOSCOPY WITH ENDOBRONCHIAL ULTRASOUND;  Surgeon: Maryjane Hurter, MD;  Location: [facility] ENDOSCOPY;  Service: Pulmonary;  Laterality: Bilateral;    FAMILY HISTORY:  Family History  Problem Relation Age of Onset   Hypertension Mother     SOCIAL HISTORY:   Social History   Tobacco Use   Smoking status: Every Day    Packs/day: 1.00    Years: 60.00    Total pack years: 60.00    Types: Cigarettes   Smokeless tobacco: Never  Vaping Use   Vaping Use: Never used  Substance Use Topics   Alcohol use: No    Alcohol/week: 0.0 standard drinks of alcohol   Drug use: No    ALLERGIES: No Known Allergies  MEDICATIONS:  Current Outpatient Medications  Medication Sig Dispense Refill   acetaminophen (TYLENOL) 500 MG tablet Take 1,000 mg by mouth daily as needed for mild pain, moderate pain, fever or headache.     albuterol (VENTOLIN HFA) 108 (90 Base) MCG/ACT inhaler Inhale 2 puffs into the lungs every 6 (six) hours as needed for wheezing or shortness of breath.     amLODipine (NORVASC) 2.5 MG tablet Take 1 tablet (2.5 mg total) by mouth daily. 30 tablet 1   aspirin EC 81 MG tablet Take 81 mg by mouth daily.     atorvastatin (LIPITOR) 40 MG tablet Take 40 mg by mouth at bedtime.     clopidogrel (PLAVIX) 75 MG tablet Take 75 mg by mouth daily.     guaiFENesin (MUCINEX) 600 MG 12 hr tablet Take 1 tablet (600 mg total) by mouth 2 (two) times daily. 30 tablet 0   hydrALAZINE (APRESOLINE) 10 MG tablet Take 10 mg by mouth 2 (two) times daily.     lisinopril (ZESTRIL) 10 MG tablet Take 10 mg by mouth daily.     Multiple Vitamins-Minerals (CENTRUM SILVER 50+MEN) TABS Take 1 tablet by mouth at bedtime.     nicotine (NICODERM CQ - DOSED IN MG/24 HOURS) 21 mg/24hr patch Place 1 patch (21 mg total) onto the skin daily. 28 patch 0   potassium chloride (KLOR-CON) 10 MEQ tablet Take 10 mEq by mouth daily.     predniSONE (DELTASONE) 20 MG tablet Take 2 tablets (40 mg total) by mouth daily with breakfast. 2 tablet 0   timolol (BETIMOL) 0.5 % ophthalmic solution Place 1 drop into the left eye 2 (two) times daily.     No current facility-administered medications for this encounter.    REVIEW OF SYSTEMS:  A 10+ POINT REVIEW OF SYSTEMS WAS OBTAINED including  neurology, dermatology, psychiatry, cardiac, respiratory, lymph, extremities, GI, GU, musculoskeletal, constitutional, reproductive, HEENT. ***   PHYSICAL EXAM:  vitals were not taken for this visit.   General: Alert and oriented, in no acute distress HEENT: Head is normocephalic. Extraocular movements are intact. Oropharynx is clear. Neck: Neck is supple, no palpable cervical or supraclavicular lymphadenopathy. Heart: Regular in rate and rhythm with no murmurs, rubs, or gallops. Chest: Clear to auscultation bilaterally, with no rhonchi, wheezes, or rales. Abdomen: Soft, nontender, nondistended, with no rigidity or guarding. Extremities: No cyanosis or edema. Lymphatics: see Neck Exam Skin: No concerning lesions. Musculoskeletal: symmetric strength and muscle tone throughout. Neurologic: Cranial nerves II through XII are grossly intact. No obvious focalities. Speech is fluent. Coordination is intact. Psychiatric: Judgment and insight are intact. Affect is appropriate. ***  ECOG = ***  0 - Asymptomatic (Fully active, able to carry on all predisease activities without restriction)  1 - Symptomatic but completely ambulatory (Restricted in physically strenuous activity but ambulatory and able to carry out work of a light  or sedentary nature. For example, light housework, office work)  2 - Symptomatic, <50% in bed during the day (Ambulatory and capable of all self care but unable to carry out any work activities. Up and about more than 50% of waking hours)  3 - Symptomatic, >50% in bed, but not bedbound (Capable of only limited self-care, confined to bed or chair 50% or more of waking hours)  4 - Bedbound (Completely disabled. Cannot carry on any self-care. Totally confined to bed or chair)  5 - Death   Eustace Pen MM, Creech RH, Tormey DC, et al. [id number]). "Toxicity and response criteria of the [facility]". Summers Oncol. 5 (6): 649-55  LABORATORY DATA:  Lab Results   Component Value Date   WBC 6.8 10/20/2022   HGB 11.3 (L) 10/20/2022   HCT 33.5 (L) 10/20/2022   MCV 94.4 10/20/2022   PLT 422 (H) 10/20/2022   NEUTROABS 3.5 10/19/2022   Lab Results  Component Value Date   NA 136 10/20/2022   K 4.1 10/20/2022   CL 101 10/20/2022   CO2 25 10/20/2022   GLUCOSE 99 10/20/2022   BUN 12 10/20/2022   CREATININE 0.84 10/20/2022   CALCIUM 9.0 10/20/2022      RADIOGRAPHY: ECHOCARDIOGRAM COMPLETE  Result Date: 10/20/2022    ECHOCARDIOGRAM REPORT   Patient Name:   KOSTA SCHNITZLER Date of Exam: 10/20/2022 Medical Rec #:  [id number]   Height:       68.0 in Accession #:    [id number]  Weight:       129.0 lb Date of Birth:  08-17-1941    BSA:          1.696 m Patient Age:    9 years    BP:           137/75 mmHg Patient Gender: M           HR:           80 bpm. Exam Location:  Inpatient Procedure: 2D Echo, Cardiac Doppler and Color Doppler Indications:    Chest Pain  History:        Patient has prior history of Echocardiogram examinations, most                 recent 07/26/2020. Stroke; Risk Factors:Hypertension and                 Dyslipidemia.  Sonographer:    Eartha Inch Referring Phys: [place]  Sonographer Comments: No parasternal window, no apical window and Technically challenging study due to limited acoustic windows. Image acquisition challenging due to patient body habitus. IMPRESSIONS  1. Limited views but good quality pictures. Left ventricular ejection fraction, by estimation, is 60 to 65%. The left ventricle has normal function. The left ventricle has no regional wall motion abnormalities. Left ventricular diastolic function could not be evaluated.  2. Right ventricular systolic function is normal. The right ventricular size is normal.  3. Left atrial size was moderately dilated.  4. Right atrial size was mildly dilated.  5. There is no evidence of cardiac tamponade.  6. The mitral valve is degenerative. Mild mitral valve regurgitation. Moderate mitral  annular calcification.  7. The aortic valve is tricuspid. There is moderate calcification of the aortic valve. There is mild thickening of the aortic valve. Aortic valve regurgitation is not visualized. Aortic valve sclerosis/calcification is present, without any evidence of aortic stenosis.  8. There is mild (Grade II) atheroma plaque involving  the ascending aorta and aortic root. FINDINGS  Left Ventricle: Limited views but good quality pictures. Left ventricular ejection fraction, by estimation, is 60 to 65%. The left ventricle has normal function. The left ventricle has no regional wall motion abnormalities. The left ventricular internal  cavity size was normal in size. There is no left ventricular hypertrophy. Left ventricular diastolic function could not be evaluated. Right Ventricle: The right ventricular size is normal. No increase in right ventricular wall thickness. Right ventricular systolic function is normal. Left Atrium: Left atrial size was moderately dilated. Right Atrium: Right atrial size was mildly dilated. Pericardium: Trivial pericardial effusion is present. There is no evidence of cardiac tamponade. Mitral Valve: The mitral valve is degenerative in appearance. There is moderate calcification of the posterior mitral valve leaflet(s). Normal mobility of the mitral valve leaflets. Moderate mitral annular calcification. Mild mitral valve regurgitation. Tricuspid Valve: The tricuspid valve is normal in structure. Tricuspid valve regurgitation is mild. Aortic Valve: The aortic valve is tricuspid. There is moderate calcification of the aortic valve. There is mild thickening of the aortic valve. There is mild to moderate aortic valve annular calcification. Aortic valve regurgitation is not visualized. Aortic valve sclerosis/calcification is present, without any evidence of aortic stenosis. Pulmonic Valve: The pulmonic valve was normal in structure. Pulmonic valve regurgitation is trivial. Aorta: The  aortic root is normal in size and structure. There is mild (Grade II) atheroma plaque involving the ascending aorta and aortic root. IAS/Shunts: The atrial septum is grossly normal.  IVC IVC diam: 1.30 cm LEFT ATRIUM           Index        RIGHT ATRIUM           Index LA Vol (A4C): 53.3 ml 31.42 ml/m  RA Area:     12.80 cm                                    RA Volume:   30.60 ml  18.04 ml/m Dixie Dials MD Electronically signed by Dixie Dials MD Signature Date/Time: 10/20/2022/5:44:50 PM    Final    CT CHEST W CONTRAST  Result Date: 10/20/2022 CLINICAL DATA:  Lung cancer; * Tracking Code: BO * EXAM: CT CHEST WITH CONTRAST TECHNIQUE: Multidetector CT imaging of the chest was performed during intravenous contrast administration. RADIATION DOSE REDUCTION: This exam was performed according to the departmental dose-optimization program which includes automated exposure control, adjustment of the mA and/or kV according to patient size and/or use of iterative reconstruction technique. CONTRAST:  45mL OMNIPAQUE IOHEXOL 350 MG/ML SOLN COMPARISON:  None Available. FINDINGS: Cardiovascular: Normal heart size. No pericardial effusion. Normal caliber thoracic aorta with severe atherosclerotic disease. Severe coronary artery calcifications. No suspicious filling defects of the central pulmonary arteries. Mediastinum/Nodes: Esophagus and thyroid are unremarkable. Enlarged mediastinal and left hilar lymph nodes. Reference AP window lymph node measuring 1.7 cm in short axis on series 3, image 66. Reference left hilar lymph node measuring 2.4 cm in short axis on series 3, image 95. Lungs/Pleura: Severe centrilobular emphysema. Large mass of the left lower lobe measuring 8.7 by 7.1 cm on series 4, image 105. Numerous satellite nodules are seen in the left lower lobe, largest is a solid nodule measuring 2.2 x 2.1 cm on series 3, image 124. Bronchial wall thickening and consolidation of the left lower lobe, concerning for  lymphangitic spread of tumor. Additional numerous scattered  bilateral solid pulmonary nodules seen. Reference solid nodule of the right upper lobe measuring 1.3 x 1.2 cm on series 4, image 61. Linear opacity of the right upper lobe, likely scarring. No pleural effusion or pneumothorax Upper Abdomen: No acute abnormality. Musculoskeletal: Small sclerotic lesion of T10. IMPRESSION: 1. Large mass of the left lower lobe with numerous satellite nodules, highly concerning for primary lung malignancy. 2. Bronchial wall thickening and consolidation of the left lower lobe, concerning for lymphangitic spread of tumor. 3. Enlarged mediastinal and left hilar lymph nodes, compatible with metastatic disease. 4. Additional numerous scattered bilateral solid pulmonary nodules, largest measures up to 1.3 cm, concerning for metastatic disease. 5. Aortic Atherosclerosis (ICD10-I70.0) and Emphysema (ICD10-J43.9). Electronically Signed   By: Yetta Glassman M.D.   On: 10/20/2022 15:11   X-ray chest PA and lateral  Result Date: 10/19/2022 CLINICAL DATA:  Acute bronchitis. EXAM: CHEST - 2 VIEW COMPARISON:  March 17, 2019. FINDINGS: The heart size and mediastinal contours are within normal limits. Large rounded mass is noted posteriorly in the left lower lobe with associated atelectasis, concerning for neoplasm. Small nodular densities are noted in the right upper lobe concerning for metastatic disease. Probable scarring is noted in right upper lobe. The visualized skeletal structures are unremarkable. IMPRESSION: Probable large rounded mass is noted posteriorly in the left lower lobe with associated left lower lobe atelectasis. Also noted are multiple small nodular densities in right upper lobe concerning for metastatic disease. CT scan of the chest with intravenous contrast is recommended for further evaluation. These results will be called to the ordering clinician or representative by the Radiologist Assistant, and communication  documented in the PACS or zVision Dashboard. Electronically Signed   By: Marijo Conception M.D.   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  We discussed the natural history of *** and general treatment, highlighting the role of radiotherapy in the management.  We discussed the available radiation techniques, and focused on the details of logistics and delivery.  We reviewed the anticipated acute and late sequelae associated with radiation in this setting.  The patient was encouraged to ask questions that I answered to the best of my ability. *** A patient consent form was discussed and signed.  We retained a copy for our records.  The patient would like to proceed with radiation and will be scheduled for CT simulation.  PLAN: ***    *** minutes of total time was spent for this patient encounter, including preparation, face-to-face counseling with the patient and coordination of care, physical exam, and documentation of the encounter.   ------------------------------------------------  Blair Promise, PhD, MD  This document serves as a record of services personally performed by Gery Pray, MD. It was created on his behalf by Roney Mans, a trained medical scribe. The creation of this record is based on the scribe's personal observations and the provider's statements to them. This document has been checked and approved by the attending provider.

## 2022-11-04 ENCOUNTER — Inpatient Hospital Stay (HOSPITAL_BASED_OUTPATIENT_CLINIC_OR_DEPARTMENT_OTHER): Payer: Medicare HMO | Admitting: Internal Medicine

## 2022-11-04 ENCOUNTER — Ambulatory Visit
Admission: RE | Admit: 2022-11-04 | Discharge: 2022-11-04 | Disposition: A | Discharge status: Home / Self Care | Payer: Medicare HMO | Source: Ambulatory Visit | Attending: Radiation Oncology | Admitting: Radiation Oncology

## 2022-11-04 ENCOUNTER — Encounter: Payer: Self-pay | Admitting: Internal Medicine

## 2022-11-04 ENCOUNTER — Encounter: Payer: Self-pay | Admitting: Radiation Oncology

## 2022-11-04 ENCOUNTER — Inpatient Hospital Stay: Payer: Medicare HMO | Attending: Internal Medicine

## 2022-11-04 VITALS — BP 122/57 | HR 70 | Temp 97.7°F | Resp 20 | Ht 69.0 in | Wt 120.0 lb

## 2022-11-04 VITALS — BP 113/65 | HR 76 | Temp 98.1°F | Resp 17 | Wt 121.6 lb

## 2022-11-04 DIAGNOSIS — C3432 Malignant neoplasm of lower lobe, left bronchus or lung: Secondary | ICD-10-CM

## 2022-11-04 DIAGNOSIS — Z7982 Long term (current) use of aspirin: Secondary | ICD-10-CM | POA: Insufficient documentation

## 2022-11-04 DIAGNOSIS — R6889 Other general symptoms and signs: Secondary | ICD-10-CM | POA: Diagnosis not present

## 2022-11-04 DIAGNOSIS — Z7952 Long term (current) use of systemic steroids: Secondary | ICD-10-CM | POA: Diagnosis not present

## 2022-11-04 DIAGNOSIS — R590 Localized enlarged lymph nodes: Secondary | ICD-10-CM | POA: Insufficient documentation

## 2022-11-04 DIAGNOSIS — C778 Secondary and unspecified malignant neoplasm of lymph nodes of multiple regions: Secondary | ICD-10-CM | POA: Insufficient documentation

## 2022-11-04 DIAGNOSIS — Z79899 Other long term (current) drug therapy: Secondary | ICD-10-CM

## 2022-11-04 DIAGNOSIS — J432 Centrilobular emphysema: Secondary | ICD-10-CM | POA: Insufficient documentation

## 2022-11-04 DIAGNOSIS — Z8673 Personal history of transient ischemic attack (TIA), and cerebral infarction without residual deficits: Secondary | ICD-10-CM | POA: Insufficient documentation

## 2022-11-04 DIAGNOSIS — C7801 Secondary malignant neoplasm of right lung: Secondary | ICD-10-CM | POA: Diagnosis not present

## 2022-11-04 DIAGNOSIS — F17210 Nicotine dependence, cigarettes, uncomplicated: Secondary | ICD-10-CM | POA: Insufficient documentation

## 2022-11-04 DIAGNOSIS — C7802 Secondary malignant neoplasm of left lung: Secondary | ICD-10-CM | POA: Diagnosis not present

## 2022-11-04 DIAGNOSIS — Z7902 Long term (current) use of antithrombotics/antiplatelets: Secondary | ICD-10-CM | POA: Insufficient documentation

## 2022-11-04 DIAGNOSIS — R918 Other nonspecific abnormal finding of lung field: Secondary | ICD-10-CM | POA: Insufficient documentation

## 2022-11-04 DIAGNOSIS — E785 Hyperlipidemia, unspecified: Secondary | ICD-10-CM | POA: Insufficient documentation

## 2022-11-04 DIAGNOSIS — I700 Atherosclerosis of aorta: Secondary | ICD-10-CM | POA: Insufficient documentation

## 2022-11-04 DIAGNOSIS — I10 Essential (primary) hypertension: Secondary | ICD-10-CM | POA: Insufficient documentation

## 2022-11-04 LAB — COMPREHENSIVE METABOLIC PANEL
ALT: 12 U/L (ref 0–44)
AST: 24 U/L (ref 15–41)
Albumin: 3.4 g/dL — ABNORMAL LOW (ref 3.5–5.0)
Alkaline Phosphatase: 66 U/L (ref 38–126)
Anion gap: 4 — ABNORMAL LOW (ref 5–15)
BUN: 21 mg/dL (ref 8–23)
CO2: 31 mmol/L (ref 22–32)
Calcium: 9.3 mg/dL (ref 8.9–10.3)
Chloride: 98 mmol/L (ref 98–111)
Creatinine, Ser: 0.85 mg/dL (ref 0.61–1.24)
GFR, Estimated: >60 mL/min (ref >60)
Glucose, Bld: 112 mg/dL — ABNORMAL HIGH (ref 70–99)
Potassium: 4.8 mmol/L (ref 3.5–5.1)
Sodium: 133 mmol/L — ABNORMAL LOW (ref 135–145)
Total Bilirubin: 0.2 mg/dL — ABNORMAL LOW (ref 0.3–1.2)
Total Protein: 7.3 g/dL (ref 6.5–8.1)

## 2022-11-04 LAB — CBC WITH DIFFERENTIAL/PLATELET
Abs Immature Granulocytes: 0.02 10*3/uL (ref 0.00–0.07)
Basophils Absolute: 0.1 10*3/uL (ref 0.0–0.1)
Basophils Relative: 1 %
Eosinophils Absolute: 0.2 10*3/uL (ref 0.0–0.5)
Eosinophils Relative: 3 %
HCT: 35.4 % — ABNORMAL LOW (ref 39.0–52.0)
Hemoglobin: 12.5 g/dL — ABNORMAL LOW (ref 13.0–17.0)
Immature Granulocytes: 0 %
Lymphocytes Relative: 22 %
Lymphs Abs: 1.4 10*3/uL (ref 0.7–4.0)
MCH: 33.2 pg (ref 26.0–34.0)
MCHC: 35.3 g/dL (ref 30.0–36.0)
MCV: 93.9 fL (ref 80.0–100.0)
Monocytes Absolute: 0.6 10*3/uL (ref 0.1–1.0)
Monocytes Relative: 9 %
Neutro Abs: 4.3 10*3/uL (ref 1.7–7.7)
Neutrophils Relative %: 65 %
Platelets: 382 10*3/uL (ref 150–400)
RBC: 3.77 MIL/uL — ABNORMAL LOW (ref 4.22–5.81)
RDW: 12.6 % (ref 11.5–15.5)
WBC: 6.5 10*3/uL (ref 4.0–10.5)
nRBC: 0 % (ref 0.0–0.2)

## 2022-11-04 NOTE — Progress Notes (Signed)
[place] [phone]   [phone]  CONSULT NOTE  REFERRING PHYSICIAN: Dr. Leslye Peer  REASON FOR CONSULTATION:  82 years old African-American male recently diagnosed with lung cancer  HPI Terry Knox is a 82 y.o. male very poor historian with past medical history significant for hypertension, dyslipidemia, stroke, glaucoma as well as artificial right eye and long history of smoking.  The patient mentions that he has been complaining of cough productive of sputum as well as shortness of breath for few weeks.  He was also found to have worsening weakness as well as right-sided chest pain.  He presented to his primary care physician for evaluation and chest x-ray was performed on 10/19/2022 and that showed probable large rounded mass noted posteriorly in the left lower lobe with associated left lower lobe atelectasis.  There were also multiple small nodular density in the right upper lobe concerning for metastatic disease.  CT scan of the chest with contrast was performed on 10/20/2022 and that showed large mass of the left lower lobe measuring 8.7 x 7.1 cm.  There was numerous satellite nodules seen in the left lower lobes the largest is a solid nodule measuring 2.2 x 2.1 cm.  There was also bronchial wall thickening and consolidation of the left lower lobe concerning for lymphangitic spread of tumor.  Additional numerous scattered bilateral solid pulmonary nodules were seen including a solid nodule of the right upper lobe measuring 1.3 x 1.2 cm.  The patient also had enlarged mediastinal and left hilar lymph nodes including AP window lymph node measuring 1.7 cm and the reference left hilar lymph node measuring 2.4 cm.  The patient was seen by Dr. Verlee Monte and on 10/22/2022 he underwent flexible fiberoptic bronchoscopy with EBUS and biopsy of the left lower lobe lung mass as well as 11 L station lymph node. The final pathology ([id number]) Opdualag C11 L lymph node  fine-needle aspiration showed malignant cells consistent with a small cell carcinoma. The patient was referred to me today for evaluation and recommendation regarding treatment of his condition.  He had MRI of the brain performed today but the final report is still pending .  Seen today the patient continues to complain of shortness of breath as well as pain on the right arm and 20 pounds of weight loss.  He has no cough or hemoptysis.  He has no nausea, vomiting, diarrhea or constipation.  He has no fever or chills. Family history significant for mother with heart disease.  Father had stroke and sister had cancer. The patient is single and has 2 daughters 1 lives in [place] and the other 1 lives in [place].  He used to work in [employer].  He has a history for smoking for over 65 years and unfortunately he continues to smoke.  He has no history of alcohol or drug abuse. HPI  Past Medical History:  Diagnosis Date   Hyperlipidemia    Hypertension    Stroke ([place]) 09/2021    Past Surgical History:  Procedure Laterality Date   BRONCHIAL NEEDLE ASPIRATION BIOPSY  10/22/2022   Procedure: BRONCHIAL NEEDLE ASPIRATION BIOPSIES;  Surgeon: Maryjane Hurter, MD;  Location: [facility] ENDOSCOPY;  Service: Pulmonary;;   CARDIAC CATHETERIZATION N/A 06/07/2015   Procedure: Left Heart Cath and Coronary Angiography;  Surgeon: Dixie Dials, MD;  Location: [facility];  Service: Cardiovascular;  Laterality: N/A;   ENUCLEATION Right    RUPTURED GLOBE EXPLORATION AND REPAIR Left  TOTAL HIP ARTHROPLASTY Right 12/02/2021   Procedure: TOTAL HIP ARTHROPLASTY;  Surgeon: Marchia Bond, MD;  Location: [facility] ORS;  Service: Orthopedics;  Laterality: Right;   VIDEO BRONCHOSCOPY WITH ENDOBRONCHIAL ULTRASOUND Bilateral 10/22/2022   Procedure: VIDEO BRONCHOSCOPY WITH ENDOBRONCHIAL ULTRASOUND;  Surgeon: Maryjane Hurter, MD;  Location: [facility] ENDOSCOPY;  Service: Pulmonary;  Laterality: Bilateral;    Family History   Problem Relation Age of Onset   Hypertension Mother     Social History Social History   Tobacco Use   Smoking status: Every Day    Packs/day: 1.00    Years: 60.00    Total pack years: 60.00    Types: Cigarettes   Smokeless tobacco: Never  Vaping Use   Vaping Use: Never used  Substance Use Topics   Alcohol use: No    Alcohol/week: 0.0 standard drinks of alcohol   Drug use: No    No Known Allergies  Current Outpatient Medications  Medication Sig Dispense Refill   acetaminophen (TYLENOL) 500 MG tablet Take 1,000 mg by mouth daily as needed for mild pain, moderate pain, fever or headache.     albuterol (VENTOLIN HFA) 108 (90 Base) MCG/ACT inhaler Inhale 2 puffs into the lungs every 6 (six) hours as needed for wheezing or shortness of breath.     amLODipine (NORVASC) 2.5 MG tablet Take 1 tablet (2.5 mg total) by mouth daily. 30 tablet 1   aspirin EC 81 MG tablet Take 81 mg by mouth daily.     atorvastatin (LIPITOR) 40 MG tablet Take 40 mg by mouth at bedtime.     clopidogrel (PLAVIX) 75 MG tablet Take 75 mg by mouth daily.     guaiFENesin (MUCINEX) 600 MG 12 hr tablet Take 1 tablet (600 mg total) by mouth 2 (two) times daily. 30 tablet 0   hydrALAZINE (APRESOLINE) 10 MG tablet Take 10 mg by mouth 2 (two) times daily.     lisinopril (ZESTRIL) 10 MG tablet Take 10 mg by mouth daily.     Multiple Vitamins-Minerals (CENTRUM SILVER 50+MEN) TABS Take 1 tablet by mouth at bedtime.     nicotine (NICODERM CQ - DOSED IN MG/24 HOURS) 21 mg/24hr patch Place 1 patch (21 mg total) onto the skin daily. 28 patch 0   potassium chloride (KLOR-CON) 10 MEQ tablet Take 10 mEq by mouth daily.     predniSONE (DELTASONE) 20 MG tablet Take 2 tablets (40 mg total) by mouth daily with breakfast. 2 tablet 0   timolol (BETIMOL) 0.5 % ophthalmic solution Place 1 drop into the left eye 2 (two) times daily.     No current facility-administered medications for this visit.    Review of  Systems  Constitutional: positive for anorexia, fatigue, and weight loss Eyes: Right artificial eye Ears, nose, mouth, throat, and face: negative Respiratory: positive for cough, dyspnea on exertion, and pleurisy/chest pain Cardiovascular: negative Gastrointestinal: negative Genitourinary:negative Integument/breast: negative Hematologic/lymphatic: negative Musculoskeletal:negative Neurological: negative Behavioral/Psych: negative Endocrine: negative Allergic/Immunologic: negative  Physical Exam  WJX:BJYNW, healthy, no distress, well nourished, and well developed SKIN: skin color, texture, turgor are normal, no rashes or significant lesions HEAD: Normocephalic, No masses, lesions, tenderness or abnormalities EYES: normal, PERRLA, Conjunctiva are pink and non-injected EARS: External ears normal, Canals clear OROPHARYNX:no exudate, no erythema, and lips, buccal mucosa, and tongue normal  NECK: supple, no adenopathy, no JVD LYMPH:  no palpable lymphadenopathy, no hepatosplenomegaly LUNGS: clear to auscultation , and palpation HEART: regular rate & rhythm, no murmurs, and no gallops ABDOMEN:abdomen soft,  non-tender, normal bowel sounds, and no masses or organomegaly BACK: Back symmetric, no curvature., No CVA tenderness EXTREMITIES:no joint deformities, effusion, or inflammation, no edema  NEURO: alert & oriented x 3 with fluent speech, no focal motor/sensory deficits  PERFORMANCE STATUS: ECOG 1-2  LABORATORY DATA: Lab Results  Component Value Date   WBC 6.5 11/04/2022   HGB 12.5 (L) 11/04/2022   HCT 35.4 (L) 11/04/2022   MCV 93.9 11/04/2022   PLT 382 11/04/2022      Chemistry      Component Value Date/Time   NA 133 (L) 11/04/2022 1136   K 4.8 11/04/2022 1136   CL 98 11/04/2022 1136   CO2 31 11/04/2022 1136   BUN 21 11/04/2022 1136   CREATININE 0.85 11/04/2022 1136      Component Value Date/Time   CALCIUM 9.3 11/04/2022 1136   ALKPHOS 66 11/04/2022 1136   AST 24  11/04/2022 1136   ALT 12 11/04/2022 1136   BILITOT 0.2 (L) 11/04/2022 1136       RADIOGRAPHIC STUDIES: ECHOCARDIOGRAM COMPLETE  Result Date: 10/20/2022    ECHOCARDIOGRAM REPORT   Patient Name:   ALMOND FITZGIBBON Date of Exam: 10/20/2022 Medical Rec #:  [id number]   Height:       68.0 in Accession #:    [id number]  Weight:       129.0 lb Date of Birth:  [birth year]    BSA:          1.696 m Patient Age:    78 years    BP:           137/75 mmHg Patient Gender: M           HR:           80 bpm. Exam Location:  Inpatient Procedure: 2D Echo, Cardiac Doppler and Color Doppler Indications:    Chest Pain  History:        Patient has prior history of Echocardiogram examinations, most                 recent 07/26/2020. Stroke; Risk Factors:Hypertension and                 Dyslipidemia.  Sonographer:    Eartha Inch Referring Phys: [place]  Sonographer Comments: No parasternal window, no apical window and Technically challenging study due to limited acoustic windows. Image acquisition challenging due to patient body habitus. IMPRESSIONS  1. Limited views but good quality pictures. Left ventricular ejection fraction, by estimation, is 60 to 65%. The left ventricle has normal function. The left ventricle has no regional wall motion abnormalities. Left ventricular diastolic function could not be evaluated.  2. Right ventricular systolic function is normal. The right ventricular size is normal.  3. Left atrial size was moderately dilated.  4. Right atrial size was mildly dilated.  5. There is no evidence of cardiac tamponade.  6. The mitral valve is degenerative. Mild mitral valve regurgitation. Moderate mitral annular calcification.  7. The aortic valve is tricuspid. There is moderate calcification of the aortic valve. There is mild thickening of the aortic valve. Aortic valve regurgitation is not visualized. Aortic valve sclerosis/calcification is present, without any evidence of aortic stenosis.  8. There is mild  (Grade II) atheroma plaque involving the ascending aorta and aortic root. FINDINGS  Left Ventricle: Limited views but good quality pictures. Left ventricular ejection fraction, by estimation, is 60 to 65%. The left ventricle has normal function. The left ventricle has no  regional wall motion abnormalities. The left ventricular internal  cavity size was normal in size. There is no left ventricular hypertrophy. Left ventricular diastolic function could not be evaluated. Right Ventricle: The right ventricular size is normal. No increase in right ventricular wall thickness. Right ventricular systolic function is normal. Left Atrium: Left atrial size was moderately dilated. Right Atrium: Right atrial size was mildly dilated. Pericardium: Trivial pericardial effusion is present. There is no evidence of cardiac tamponade. Mitral Valve: The mitral valve is degenerative in appearance. There is moderate calcification of the posterior mitral valve leaflet(s). Normal mobility of the mitral valve leaflets. Moderate mitral annular calcification. Mild mitral valve regurgitation. Tricuspid Valve: The tricuspid valve is normal in structure. Tricuspid valve regurgitation is mild. Aortic Valve: The aortic valve is tricuspid. There is moderate calcification of the aortic valve. There is mild thickening of the aortic valve. There is mild to moderate aortic valve annular calcification. Aortic valve regurgitation is not visualized. Aortic valve sclerosis/calcification is present, without any evidence of aortic stenosis. Pulmonic Valve: The pulmonic valve was normal in structure. Pulmonic valve regurgitation is trivial. Aorta: The aortic root is normal in size and structure. There is mild (Grade II) atheroma plaque involving the ascending aorta and aortic root. IAS/Shunts: The atrial septum is grossly normal.  IVC IVC diam: 1.30 cm LEFT ATRIUM           Index        RIGHT ATRIUM           Index LA Vol (A4C): 53.3 ml 31.42 ml/m  RA Area:      12.80 cm                                    RA Volume:   30.60 ml  18.04 ml/m Dixie Dials MD Electronically signed by Dixie Dials MD Signature Date/Time: 10/20/2022/5:44:50 PM    Final    CT CHEST W CONTRAST  Result Date: 10/20/2022 CLINICAL DATA:  Lung cancer; * Tracking Code: BO * EXAM: CT CHEST WITH CONTRAST TECHNIQUE: Multidetector CT imaging of the chest was performed during intravenous contrast administration. RADIATION DOSE REDUCTION: This exam was performed according to the departmental dose-optimization program which includes automated exposure control, adjustment of the mA and/or kV according to patient size and/or use of iterative reconstruction technique. CONTRAST:  32mL OMNIPAQUE IOHEXOL 350 MG/ML SOLN COMPARISON:  None Available. FINDINGS: Cardiovascular: Normal heart size. No pericardial effusion. Normal caliber thoracic aorta with severe atherosclerotic disease. Severe coronary artery calcifications. No suspicious filling defects of the central pulmonary arteries. Mediastinum/Nodes: Esophagus and thyroid are unremarkable. Enlarged mediastinal and left hilar lymph nodes. Reference AP window lymph node measuring 1.7 cm in short axis on series 3, image 66. Reference left hilar lymph node measuring 2.4 cm in short axis on series 3, image 95. Lungs/Pleura: Severe centrilobular emphysema. Large mass of the left lower lobe measuring 8.7 by 7.1 cm on series 4, image 105. Numerous satellite nodules are seen in the left lower lobe, largest is a solid nodule measuring 2.2 x 2.1 cm on series 3, image 124. Bronchial wall thickening and consolidation of the left lower lobe, concerning for lymphangitic spread of tumor. Additional numerous scattered bilateral solid pulmonary nodules seen. Reference solid nodule of the right upper lobe measuring 1.3 x 1.2 cm on series 4, image 61. Linear opacity of the right upper lobe, likely scarring. No pleural effusion or pneumothorax Upper  Abdomen: No acute abnormality.  Musculoskeletal: Small sclerotic lesion of T10. IMPRESSION: 1. Large mass of the left lower lobe with numerous satellite nodules, highly concerning for primary lung malignancy. 2. Bronchial wall thickening and consolidation of the left lower lobe, concerning for lymphangitic spread of tumor. 3. Enlarged mediastinal and left hilar lymph nodes, compatible with metastatic disease. 4. Additional numerous scattered bilateral solid pulmonary nodules, largest measures up to 1.3 cm, concerning for metastatic disease. 5. Aortic Atherosclerosis (ICD10-I70.0) and Emphysema (ICD10-J43.9). Electronically Signed   By: Yetta Glassman M.D.   On: 10/20/2022 15:11   X-ray chest PA and lateral  Result Date: 10/19/2022 CLINICAL DATA:  Acute bronchitis. EXAM: CHEST - 2 VIEW COMPARISON:  March 17, 2019. FINDINGS: The heart size and mediastinal contours are within normal limits. Large rounded mass is noted posteriorly in the left lower lobe with associated atelectasis, concerning for neoplasm. Small nodular densities are noted in the right upper lobe concerning for metastatic disease. Probable scarring is noted in right upper lobe. The visualized skeletal structures are unremarkable. IMPRESSION: Probable large rounded mass is noted posteriorly in the left lower lobe with associated left lower lobe atelectasis. Also noted are multiple small nodular densities in right upper lobe concerning for metastatic disease. CT scan of the chest with intravenous contrast is recommended for further evaluation. These results will be called to the ordering clinician or representative by the Radiologist Assistant, and communication documented in the PACS or zVision Dashboard. Electronically Signed   By: Marijo Conception M.D.   On: 10/19/2022 16:26    ASSESSMENT: This is a very pleasant 82 years old African-American male with extensive stage (T4, N2, M1a) small cell lung cancer presented with large left lower lobe lung mass in addition to left hilar and  mediastinal lymphadenopathy and bilateral pulmonary nodules diagnosed in February 2024.   PLAN: I had a lengthy discussion with the patient today about his current condition and treatment options. I explained to the patient that he has incurable condition and all the treatment will be of palliative nature.  I personally and independently reviewed the scan images and discussed the result with the patient today. Unfortunately patient is not a great candidate for systemic chemotherapy because of his general condition and also lack of support at home. I recommended for the patient to consider palliative radiotherapy to the large obstructive left lower lobe lung mass. He may be a good candidate for palliative care and hospice.  I think chemotherapy will be more risky in his situation with the lack of appropriate support at home. I explained to the patient if he has more support at home from his daughters or other family members, I may consider him for reduced dose systemic chemotherapy in combination with immunotherapy but otherwise palliative care and hospice are very appropriate in his situation. I will try to reach out to one of his daughter for more discussion before we decide the next step. For the palliative radiotherapy he is seen by radiation oncology and he we will proceed with treatment. I will arrange for the patient a follow-up appointment after speaking to his daughters if we decide to proceed with any palliative systemic therapy. The patient was advised to call immediately if he has any other concerning symptoms in the interval.  The patient voices understanding of current disease status and treatment options and is in agreement with the current care plan.  All questions were answered. The patient knows to call the clinic with any problems, questions or  concerns. We can certainly see the patient much sooner if necessary.  Thank you so much for allowing me to participate in the care of [place]. I will continue to follow up the patient with you and assist in his care.  The total time spent in the appointment was 60 minutes.  Disclaimer: This note was dictated with voice recognition software. Similar sounding words can inadvertently be transcribed and may not be corrected upon review.   Eilleen Kempf November 04, 2022, 12:07 PM

## 2022-11-05 IMAGING — MR MR HEAD W/O CM
12 of 14 series · 35 of 48 positions shown · non-contrast
Comparison: Head CT from yesterday and brain MRI 06/07/2015

CLINICAL DATA: TIA.  Altered mental status with unknown cause

EXAM:
MRI HEAD WITHOUT CONTRAST
MRA HEAD WITHOUT CONTRAST
TECHNIQUE: Multiplanar, multiecho pulse sequences of the brain and surrounding
structures were obtained without intravenous contrast. Angiographic
images of the head were obtained using MRA technique without
contrast.

[Series 5: DWI · axial · 3.0mm · 0.88mm/px · z∈[-121,+27]mm · 5 of 102 slices shown (1 of 4)]
[im 1/102]
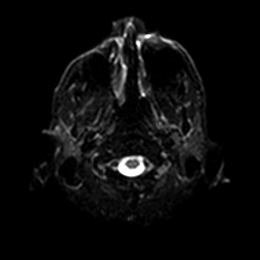
[im 26/102]
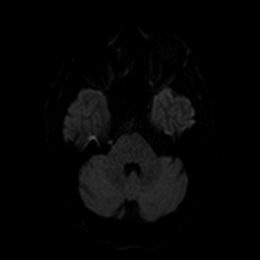
[im 51/102]
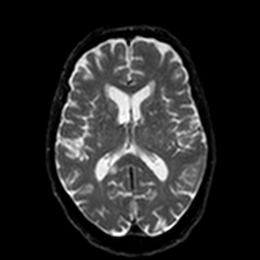
[im 76/102]
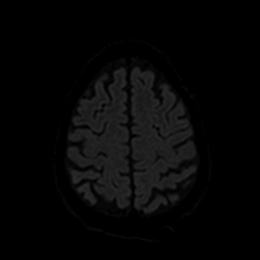
[im 102/102]
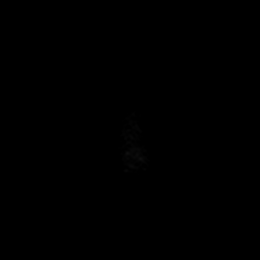

[Series 6: DWI · axial · 3.0mm · 0.88mm/px · z∈[-121,+27]mm · 3 of 47 slices shown (2 of 4)]
[im 1/47]
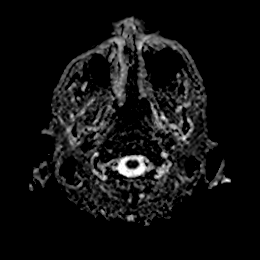
[im 24/47]
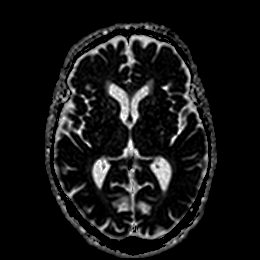
[im 47/47]
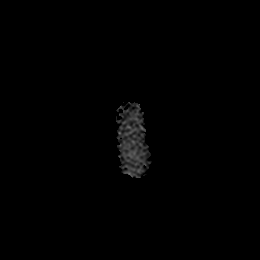

[Series 7: DWI · coronal · 4.0mm · 0.88mm/px · 5 of 78 slices shown (3 of 4)]
[im 1/78]
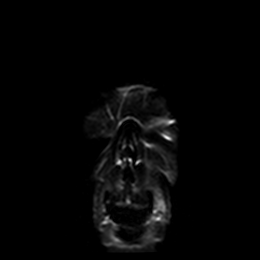
[im 20/78]
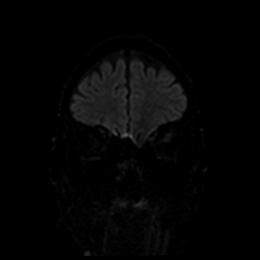
[im 39/78]
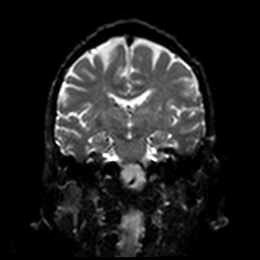
[im 58/78]
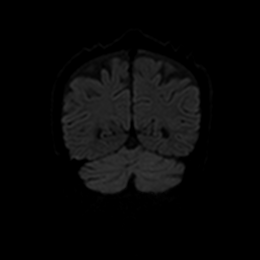
[im 78/78]
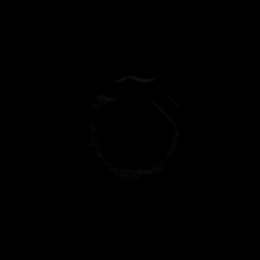

[Series 8: DWI · coronal · 4.0mm · 0.88mm/px · 2 of 39 slices shown (4 of 4)]
[im 1/39]
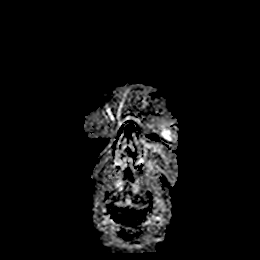
[im 39/39]
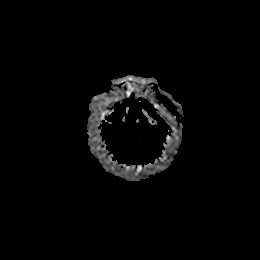

[Series 9: T1 · sagittal · 5.0mm · 0.75mm/px · 2 of 26 slices shown]
[im 1/26]
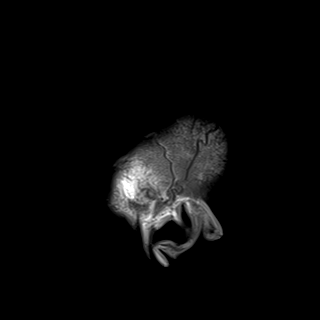
[im 26/26]
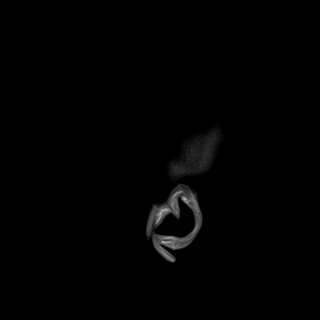

[Series 10: T2 · axial · 5.0mm · 0.72mm/px · z∈[-124,+30]mm · 2 of 27 slices shown (1 of 2)]
[im 1/27]
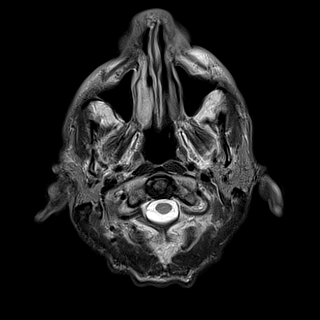
[im 27/27]
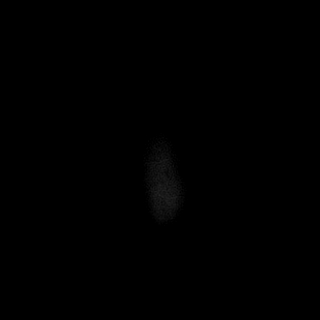

[Series 11: FLAIR · axial · 5.0mm · 0.45mm/px · z∈[-122,+33]mm · 2 of 27 slices shown]
[im 1/27]
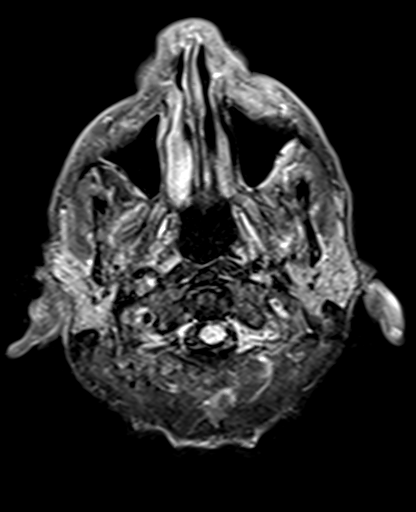
[im 27/27]
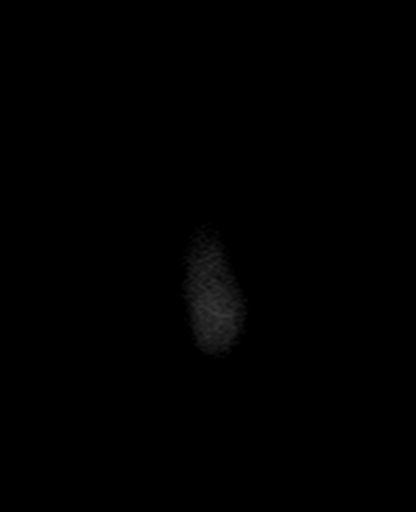

[Series 12: mag_images · axial · 3.0mm · 0.90mm/px · z∈[-120,+31]mm · 3 of 52 slices shown]
[im 1/52]
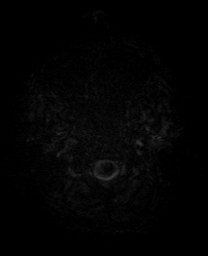
[im 26/52]
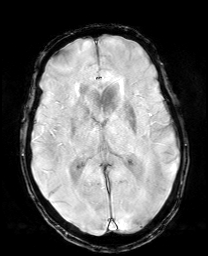
[im 52/52]
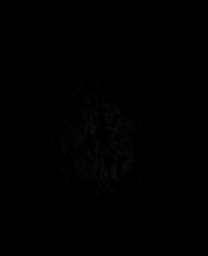

[Series 13: pha_images · axial · 3.0mm · 0.90mm/px · z∈[-120,+31]mm · 3 of 52 slices shown]
[im 1/52]
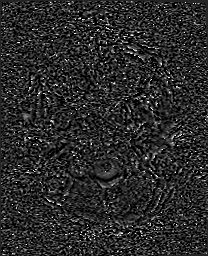
[im 26/52]
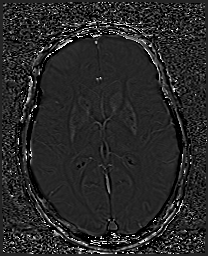
[im 52/52]
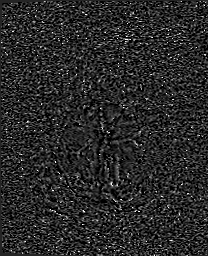

[Series 14: swi_images · axial · 3.0mm · 0.90mm/px · z∈[-120,+31]mm · 3 of 52 slices shown]
[im 1/52]
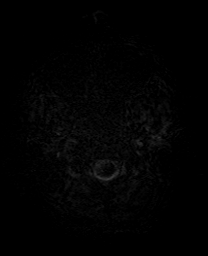
[im 26/52]
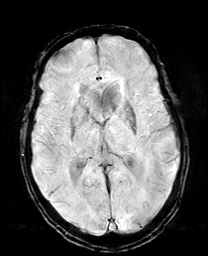
[im 52/52]
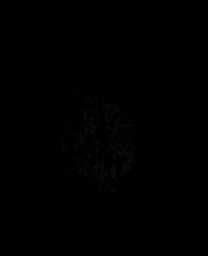

[Series 15: mip_images(sw) · axial · 24.0mm · 0.90mm/px · z∈[-110,+21]mm · 3 of 45 slices shown]
[im 1/45]
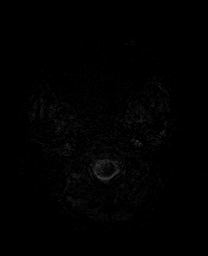
[im 23/45]
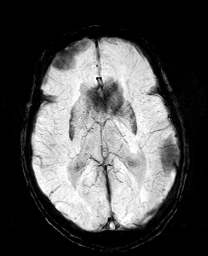
[im 45/45]
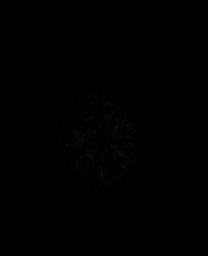

[Series 17: T2 · coronal · 5.0mm · 0.34mm/px · 2 of 32 slices shown (2 of 2)]
[im 1/32]
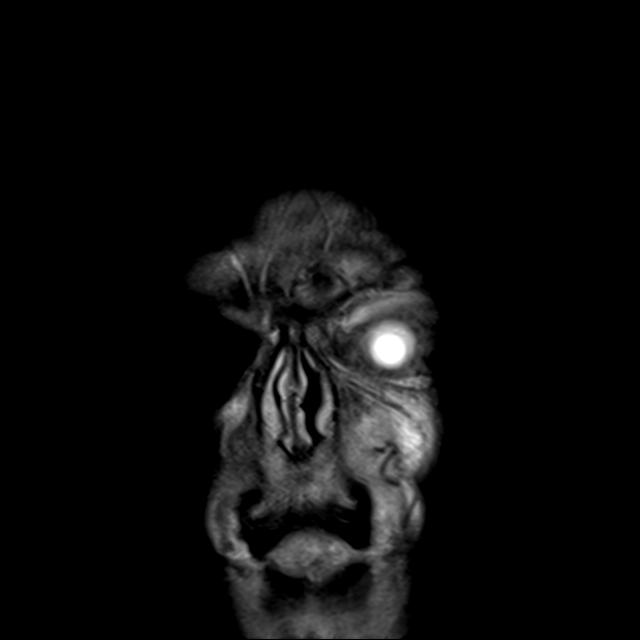
[im 32/32]
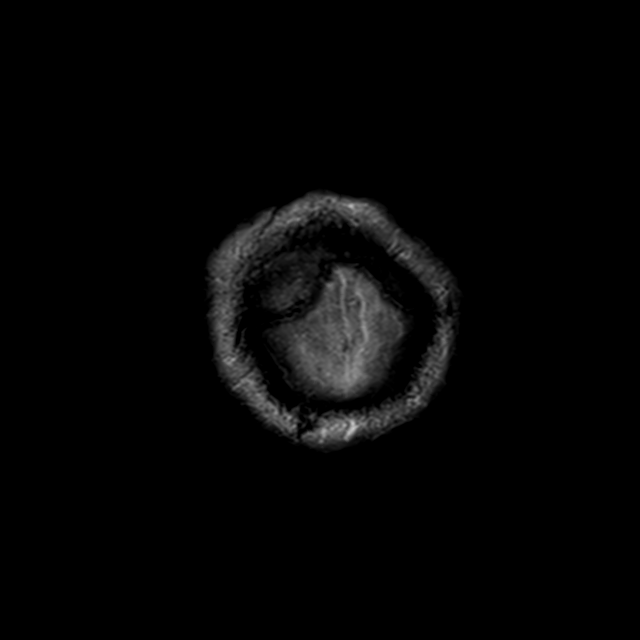

[35 of 48 positions shown; findings below may reference images not displayed]

FINDINGS: MRI HEAD FINDINGS

Brain: Subcentimeter acute infarct at the right far-lateral splenium
of the corpus callosum. Chronic small vessel ischemia in the
cerebral white matter and pons to a mild degree. Encephalomalacia in
the inferior right frontal lobe considered posttraumatic given a
right orbital roof deformity. No acute hemorrhage, hydrocephalus, or
masslike finding.

Vascular: Normal flow voids.

Skull and upper cervical spine: Normal marrow signal

Sinuses/Orbits: Right enucleation with enophthalmos of the
prosthesis. Left glaucoma reservoir.

MRA HEAD FINDINGS

The carotid, vertebral, and basilar arteries are diffusely patent.
No branch occlusion, definite beading, or convincing aneurysm. At
the circle-of-Willis aneurysm could easily be obscured, especially
of the bilateral ICA, due to the degree of motion artifact. Probable
mild atheromatous irregularity of medium size vessels. Bilateral ICA
irregularity at the skull base which is considered artifactual.
IMPRESSION: 1. Acute lacunar infarct along the right far-lateral splenium of the
corpus callosum.
2. Overall mild chronic small vessel ischemia for age.
3. Posttraumatic right inferior frontal encephalomalacia.
4. Motion degraded intracranial MRA without emergent finding or
proximal flow reducing stenosis.

## 2022-11-06 ENCOUNTER — Encounter (HOSPITAL_COMMUNITY): Payer: Self-pay

## 2022-11-08 NOTE — Progress Notes (Unsigned)
Synopsis: Referred for COPD by Dixie Dials, MD  Subjective:   PATIENT ID: Terry Knox GENDER: male DOB: Feb 06, 1941, MRN: [id number]  No chief complaint on file.  19yM with history of HTN, stroke, COPD, smoking and recently discovered extensive stage small cell lung CA  Seen during hosiptalization 10/19/22 for AECOPD and underwent EBUS and found to have extensive stage small cell lung CA  MR brain 2/22 without evidence of metastatic disease to brain  Has seen Dr. Julien Nordmann and Dr. Sondra Come. Lack of family support in town has made him poor candidate for even systemic palliative therapy.   Otherwise pertinent review of systems is negative.  Past Medical History:  Diagnosis Date   Hyperlipidemia    Hypertension    Stroke ([place]) 09/2021     Family History  Problem Relation Age of Onset   Hypertension Mother      Past Surgical History:  Procedure Laterality Date   BRONCHIAL NEEDLE ASPIRATION BIOPSY  10/22/2022   Procedure: BRONCHIAL NEEDLE ASPIRATION BIOPSIES;  Surgeon: Maryjane Hurter, MD;  Location: [facility] ENDOSCOPY;  Service: Pulmonary;;   CARDIAC CATHETERIZATION N/A 06/07/2015   Procedure: Left Heart Cath and Coronary Angiography;  Surgeon: Dixie Dials, MD;  Location: [facility];  Service: Cardiovascular;  Laterality: N/A;   ENUCLEATION Right    RUPTURED GLOBE EXPLORATION AND REPAIR Left    TOTAL HIP ARTHROPLASTY Right 12/02/2021   Procedure: TOTAL HIP ARTHROPLASTY;  Surgeon: Marchia Bond, MD;  Location: [facility] ORS;  Service: Orthopedics;  Laterality: Right;   VIDEO BRONCHOSCOPY WITH ENDOBRONCHIAL ULTRASOUND Bilateral 10/22/2022   Procedure: VIDEO BRONCHOSCOPY WITH ENDOBRONCHIAL ULTRASOUND;  Surgeon: Maryjane Hurter, MD;  Location: [facility] ENDOSCOPY;  Service: Pulmonary;  Laterality: Bilateral;    Social History   Socioeconomic History   Marital status: Divorced    Spouse name: Not on file   Number of children: Not on file   Years of education: Not on file   Highest  education level: Not on file  Occupational History   Not on file  Tobacco Use   Smoking status: Every Day    Packs/day: 0.25    Years: 60.00    Total pack years: 15.00    Types: Cigarettes   Smokeless tobacco: Never   Tobacco comments:    ~5 cigarettes/day  Vaping Use   Vaping Use: Never used  Substance and Sexual Activity   Alcohol use: No    Alcohol/week: 0.0 standard drinks of alcohol   Drug use: No   Sexual activity: Not Currently  Other Topics Concern   Not on file  Social History Narrative   Not on file   Social Determinants of Health   Financial Resource Strain: Not on file  Food Insecurity: No Food Insecurity (11/04/2022)   Hunger Vital Sign    Worried About Running Out of Food in the Last Year: Never true    Ran Out of Food in the Last Year: Never true  Transportation Needs: No Transportation Needs (11/04/2022)   PRAPARE - Hydrologist (Medical): No    Lack of Transportation (Non-Medical): No  Physical Activity: Not on file  Stress: Not on file  Social Connections: Not on file  Intimate Partner Violence: Not At Risk (11/04/2022)   Humiliation, Afraid, Rape, and Kick questionnaire    Fear of Current or Ex-Partner: No    Emotionally Abused: No    Physically Abused: No    Sexually Abused: No     No Known  Allergies   Outpatient Medications Prior to Visit  Medication Sig Dispense Refill   acetaminophen (TYLENOL) 500 MG tablet Take 1,000 mg by mouth daily as needed for mild pain, moderate pain, fever or headache.     albuterol (VENTOLIN HFA) 108 (90 Base) MCG/ACT inhaler Inhale 2 puffs into the lungs every 6 (six) hours as needed for wheezing or shortness of breath.     amLODipine (NORVASC) 2.5 MG tablet Take 1 tablet (2.5 mg total) by mouth daily. 30 tablet 1   aspirin EC 81 MG tablet Take 81 mg by mouth daily.     atorvastatin (LIPITOR) 40 MG tablet Take 40 mg by mouth at bedtime.     clopidogrel (PLAVIX) 75 MG tablet Take 75 mg by  mouth daily.     guaiFENesin (MUCINEX) 600 MG 12 hr tablet Take 1 tablet (600 mg total) by mouth 2 (two) times daily. 30 tablet 0   hydrALAZINE (APRESOLINE) 10 MG tablet Take 10 mg by mouth 2 (two) times daily.     lisinopril (ZESTRIL) 10 MG tablet Take 10 mg by mouth daily.     Multiple Vitamins-Minerals (CENTRUM SILVER 50+MEN) TABS Take 1 tablet by mouth at bedtime.     nicotine (NICODERM CQ - DOSED IN MG/24 HOURS) 21 mg/24hr patch Place 1 patch (21 mg total) onto the skin daily. 28 patch 0   potassium chloride (KLOR-CON) 10 MEQ tablet Take 10 mEq by mouth daily.     predniSONE (DELTASONE) 20 MG tablet Take 2 tablets (40 mg total) by mouth daily with breakfast. 2 tablet 0   timolol (BETIMOL) 0.5 % ophthalmic solution Place 1 drop into the left eye 2 (two) times daily.     No facility-administered medications prior to visit.       Objective:   Physical Exam:  General appearance: 82 y.o., male, NAD, conversant  Eyes: anicteric sclerae; PERRL, tracking appropriately HENT: NCAT; MMM Neck: Trachea midline; no lymphadenopathy, no JVD Lungs: CTAB, no crackles, no wheeze, with normal respiratory effort CV: RRR, no murmur  Abdomen: Soft, non-tender; non-distended, BS present  Extremities: No peripheral edema, warm Skin: Normal turgor and texture; no rash Psych: Appropriate affect Neuro: Alert and oriented to person and place, no focal deficit     There were no vitals filed for this visit.   on *** LPM *** RA BMI Readings from Last 3 Encounters:  11/04/22 17.72 kg/m  11/04/22 18.49 kg/m  12/02/21 19.61 kg/m   Wt Readings from Last 3 Encounters:  11/04/22 120 lb (54.4 kg)  11/04/22 121 lb 9.6 oz (55.2 kg)  12/02/21 129 lb (58.5 kg)     CBC    Component Value Date/Time   WBC 6.5 11/04/2022 1136   RBC 3.77 (L) 11/04/2022 1136   HGB 12.5 (L) 11/04/2022 1136   HCT 35.4 (L) 11/04/2022 1136   PLT 382 11/04/2022 1136   MCV 93.9 11/04/2022 1136   MCH 33.2 11/04/2022 1136    MCHC 35.3 11/04/2022 1136   RDW 12.6 11/04/2022 1136   LYMPHSABS 1.4 11/04/2022 1136   MONOABS 0.6 11/04/2022 1136   EOSABS 0.2 11/04/2022 1136   BASOSABS 0.1 11/04/2022 1136    ***  Chest Imaging: ***  Pulmonary Functions Testing Results:     No data to display          FeNO: ***  Pathology: ***  Echocardiogram: ***  Heart Catheterization: ***    Assessment & Plan:    Plan:      Winferd Humphrey  Merlene Pulling, MD [facility] 11/08/2022 2:06 PM

## 2022-11-09 ENCOUNTER — Ambulatory Visit (INDEPENDENT_AMBULATORY_CARE_PROVIDER_SITE_OTHER): Payer: Medicare HMO | Admitting: Student

## 2022-11-09 ENCOUNTER — Encounter: Payer: Self-pay | Admitting: Student

## 2022-11-09 VITALS — BP 110/58 | HR 67 | Temp 97.8°F | Ht 69.0 in | Wt 120.0 lb

## 2022-11-09 DIAGNOSIS — C3490 Malignant neoplasm of unspecified part of unspecified bronchus or lung: Secondary | ICD-10-CM

## 2022-11-09 MED ORDER — HYDROCODONE-ACETAMINOPHEN 5-325 MG PO TABS: 1.0000 | ORAL_TABLET | Freq: Four times a day (QID) | ORAL | 0 refills | Status: AC | PRN

## 2022-11-09 MED ORDER — ALBUTEROL SULFATE HFA 108 (90 BASE) MCG/ACT IN AERS: 1.0000 | INHALATION_SPRAY | Freq: Four times a day (QID) | RESPIRATORY_TRACT | 11 refills | Status: AC | PRN

## 2022-11-09 MED ORDER — TRELEGY ELLIPTA 100-62.5-25 MCG/ACT IN AEPB: 1.0000 | INHALATION_SPRAY | Freq: Every day | RESPIRATORY_TRACT | 11 refills | Status: AC

## 2022-11-09 NOTE — Patient Instructions (Addendum)
-   trelegy 1 puff once daily EVERYDAY for your COPD, rinse mouth after using it - albuterol 1-2 puffs every 4-6 hours as needed - norco 1 tablet every 4-6 hours as needed for pain - would go through [facility]/hospice or oncology clinic if need refill

## 2022-11-10 DIAGNOSIS — C3490 Malignant neoplasm of unspecified part of unspecified bronchus or lung: Secondary | ICD-10-CM | POA: Diagnosis not present

## 2022-11-12 ENCOUNTER — Encounter (HOSPITAL_COMMUNITY): Payer: Self-pay

## 2022-11-19 ENCOUNTER — Encounter (INDEPENDENT_AMBULATORY_CARE_PROVIDER_SITE_OTHER): Payer: Self-pay | Admitting: Ophthalmology

## 2022-11-23 ENCOUNTER — Other Ambulatory Visit: Payer: Self-pay | Admitting: Internal Medicine

## 2022-11-23 NOTE — Progress Notes (Signed)
CHART NOTE I called the patient's daughter, Elwyn Reach [phone] and discussed with her my opinion regarding her father's condition.  The daughter understands that his condition is very poor and terminal with no cure for this condition.  She is in agreement with palliative care and hospice and he is already enrolled with them.  She is very appreciative for the call. They know to call if I can help in any other way.

## 2022-12-03 ENCOUNTER — Encounter (INDEPENDENT_AMBULATORY_CARE_PROVIDER_SITE_OTHER): Payer: Self-pay | Admitting: Ophthalmology

## 2023-01-13 DEATH — deceased
